# Patient Record
Sex: Female | Born: 1970
Health system: Southern US, Community
[De-identification: ages and names within clinical notes are randomized; demographics above are authoritative.]

## PROBLEM LIST (undated history)

## (undated) DIAGNOSIS — R809 Proteinuria, unspecified: Secondary | ICD-10-CM

## (undated) DIAGNOSIS — I10 Essential (primary) hypertension: Secondary | ICD-10-CM

## (undated) HISTORY — DX: Essential (primary) hypertension: I10

---

## 1999-04-20 ENCOUNTER — Inpatient Hospital Stay (HOSPITAL_COMMUNITY): Admission: AD | Admit: 1999-04-20 | Discharge: 1999-04-20 | Payer: Self-pay | Admitting: Obstetrics

## 1999-08-15 ENCOUNTER — Encounter: Admission: RE | Admit: 1999-08-15 | Discharge: 1999-08-15 | Payer: Self-pay | Admitting: Internal Medicine

## 1999-11-23 ENCOUNTER — Emergency Department (HOSPITAL_COMMUNITY): Admission: EM | Admit: 1999-11-23 | Discharge: 1999-11-23 | Payer: Self-pay | Admitting: Emergency Medicine

## 2000-08-05 ENCOUNTER — Emergency Department (HOSPITAL_COMMUNITY): Admission: EM | Admit: 2000-08-05 | Discharge: 2000-08-05 | Payer: Self-pay | Admitting: Emergency Medicine

## 2000-08-05 ENCOUNTER — Encounter: Payer: Self-pay | Admitting: Emergency Medicine

## 2001-08-30 ENCOUNTER — Emergency Department (HOSPITAL_COMMUNITY): Admission: EM | Admit: 2001-08-30 | Discharge: 2001-08-30 | Payer: Self-pay | Admitting: *Deleted

## 2002-04-28 ENCOUNTER — Emergency Department (HOSPITAL_COMMUNITY): Admission: EM | Admit: 2002-04-28 | Discharge: 2002-04-28 | Payer: Self-pay | Admitting: Emergency Medicine

## 2004-07-08 ENCOUNTER — Ambulatory Visit: Payer: Self-pay | Admitting: Obstetrics and Gynecology

## 2004-07-08 ENCOUNTER — Inpatient Hospital Stay (HOSPITAL_COMMUNITY): Admission: AD | Admit: 2004-07-08 | Discharge: 2004-07-11 | Payer: Self-pay | Admitting: Obstetrics & Gynecology

## 2004-07-08 ENCOUNTER — Encounter: Payer: Self-pay | Admitting: Emergency Medicine

## 2004-07-17 ENCOUNTER — Ambulatory Visit: Payer: Self-pay | Admitting: *Deleted

## 2004-07-24 ENCOUNTER — Ambulatory Visit: Payer: Self-pay | Admitting: Family Medicine

## 2004-07-31 ENCOUNTER — Ambulatory Visit: Payer: Self-pay | Admitting: Obstetrics & Gynecology

## 2004-08-07 ENCOUNTER — Ambulatory Visit: Payer: Self-pay | Admitting: *Deleted

## 2004-08-14 ENCOUNTER — Ambulatory Visit: Payer: Self-pay | Admitting: Obstetrics & Gynecology

## 2004-08-21 ENCOUNTER — Ambulatory Visit: Payer: Self-pay | Admitting: Obstetrics & Gynecology

## 2004-08-21 ENCOUNTER — Ambulatory Visit (HOSPITAL_COMMUNITY): Admission: RE | Admit: 2004-08-21 | Discharge: 2004-08-21 | Payer: Self-pay | Admitting: *Deleted

## 2004-08-28 ENCOUNTER — Ambulatory Visit: Payer: Self-pay | Admitting: *Deleted

## 2004-09-04 ENCOUNTER — Ambulatory Visit: Payer: Self-pay | Admitting: *Deleted

## 2004-09-11 ENCOUNTER — Ambulatory Visit: Payer: Self-pay | Admitting: *Deleted

## 2004-09-11 ENCOUNTER — Ambulatory Visit (HOSPITAL_COMMUNITY): Admission: RE | Admit: 2004-09-11 | Discharge: 2004-09-11 | Payer: Self-pay | Admitting: *Deleted

## 2004-09-16 ENCOUNTER — Ambulatory Visit: Payer: Self-pay | Admitting: Obstetrics and Gynecology

## 2004-09-18 ENCOUNTER — Ambulatory Visit: Payer: Self-pay | Admitting: *Deleted

## 2004-09-23 ENCOUNTER — Ambulatory Visit: Payer: Self-pay | Admitting: Obstetrics and Gynecology

## 2004-09-30 ENCOUNTER — Ambulatory Visit: Payer: Self-pay | Admitting: Obstetrics and Gynecology

## 2004-10-02 ENCOUNTER — Ambulatory Visit: Payer: Self-pay | Admitting: *Deleted

## 2004-10-09 ENCOUNTER — Ambulatory Visit: Payer: Self-pay | Admitting: *Deleted

## 2004-10-14 ENCOUNTER — Ambulatory Visit: Payer: Self-pay | Admitting: Obstetrics & Gynecology

## 2004-10-14 ENCOUNTER — Inpatient Hospital Stay (HOSPITAL_COMMUNITY): Admission: RE | Admit: 2004-10-14 | Discharge: 2004-10-18 | Payer: Self-pay | Admitting: Obstetrics & Gynecology

## 2004-10-21 ENCOUNTER — Inpatient Hospital Stay (HOSPITAL_COMMUNITY): Admission: AD | Admit: 2004-10-21 | Discharge: 2004-10-21 | Payer: Self-pay | Admitting: Obstetrics & Gynecology

## 2004-12-24 ENCOUNTER — Emergency Department (HOSPITAL_COMMUNITY): Admission: EM | Admit: 2004-12-24 | Discharge: 2004-12-25 | Payer: Self-pay | Admitting: Emergency Medicine

## 2006-07-31 ENCOUNTER — Emergency Department (HOSPITAL_COMMUNITY): Admission: EM | Admit: 2006-07-31 | Discharge: 2006-07-31 | Payer: Self-pay | Admitting: Family Medicine

## 2006-09-02 IMAGING — CT CT CHEST W/ CM
1 series · 16 of 31 positions shown, 20 images · IV contrast (omni 300/100)
Comparison: none

CLINICAL DATA: Left breast infection.
 CT CHEST WITH CONTRAST, 12/25/04 AT 2842 HOURS:
TECHNIQUE: 100 cc of Omnipaque 300 Omnipaque 300.

[Series 2: routine chest · axial · 0.73mm/px · z∈[-297,-17]mm · 16 of 62 slices shown, 20 images]
[im 3/62  mediastinal]
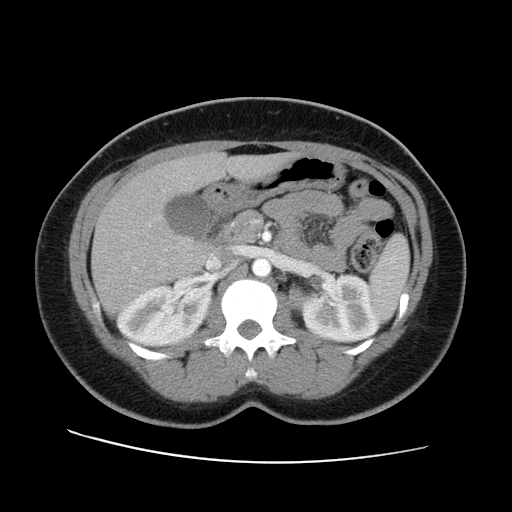
[im 3/62  lung]
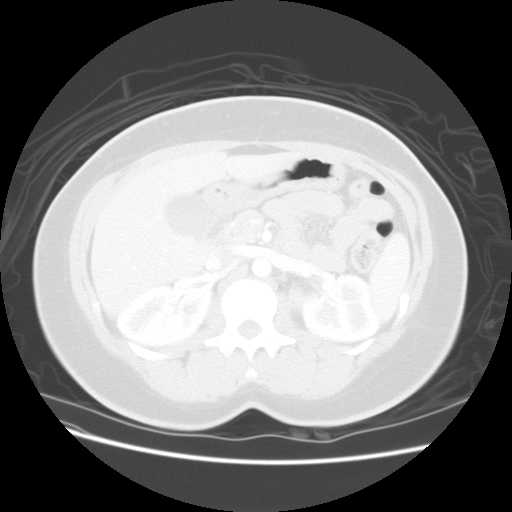
[im 7/62  lung]
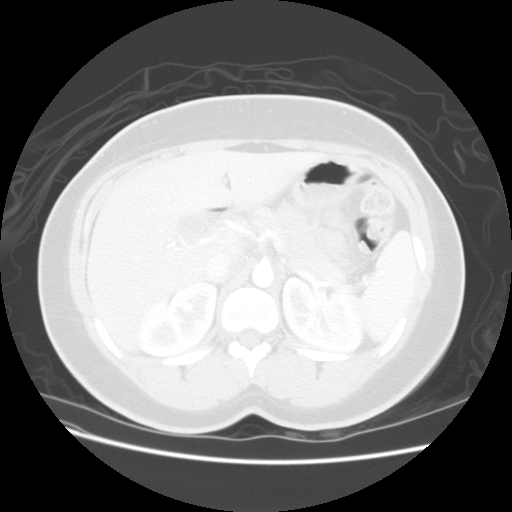
[im 12/62  lung]
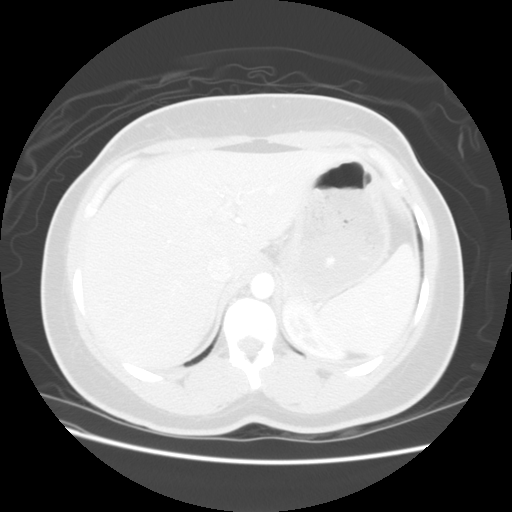
[im 14/62  lung]
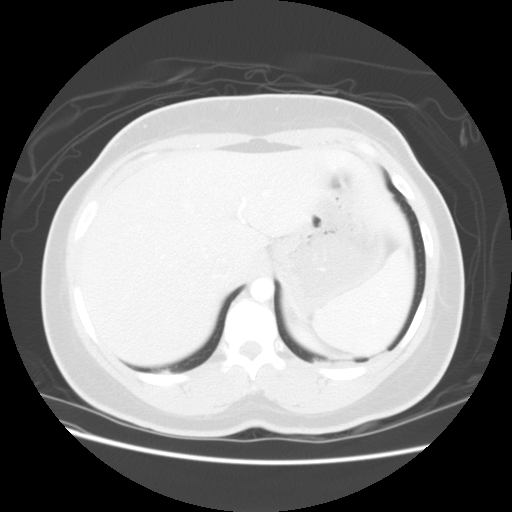
[im 19/62  mediastinal]
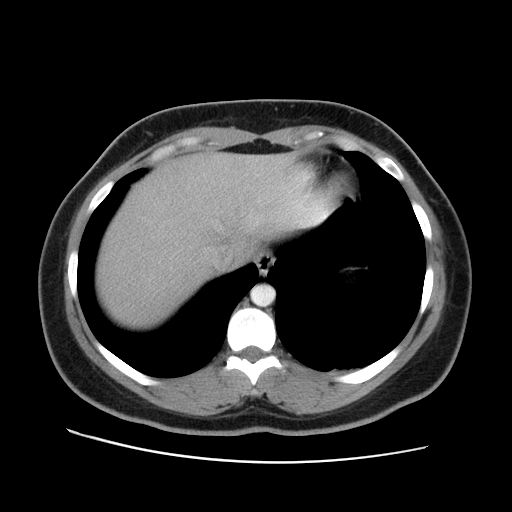
[im 19/62  lung]
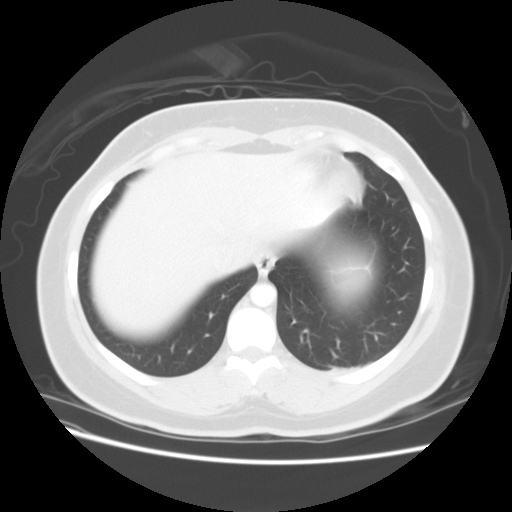
[im 21/62  lung]
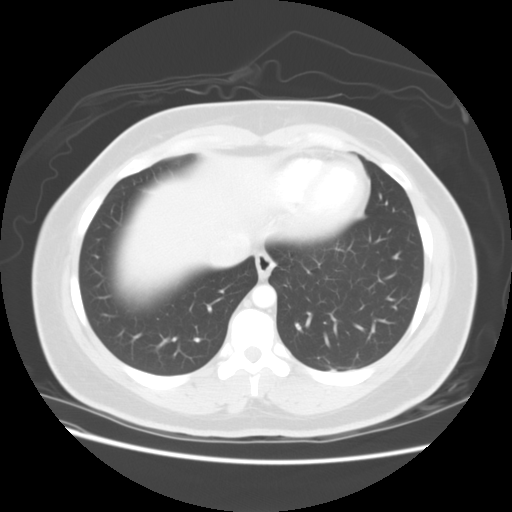
[im 25/62  lung]
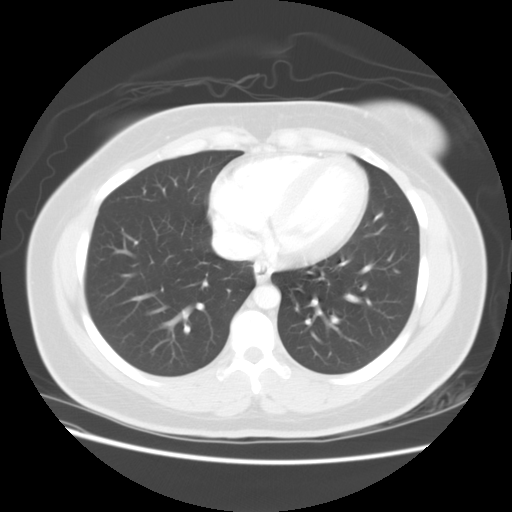
[im 30/62  lung]
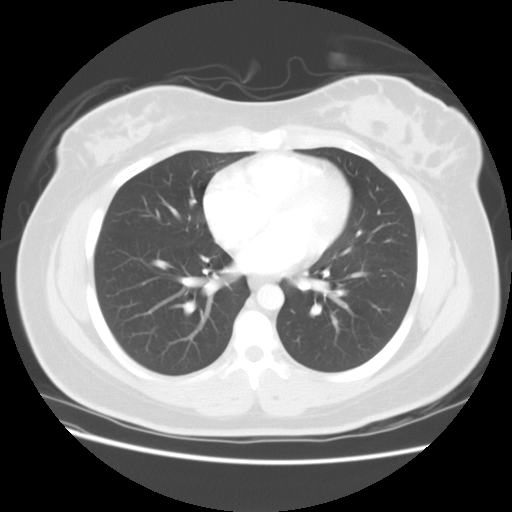
[im 33/62  mediastinal]
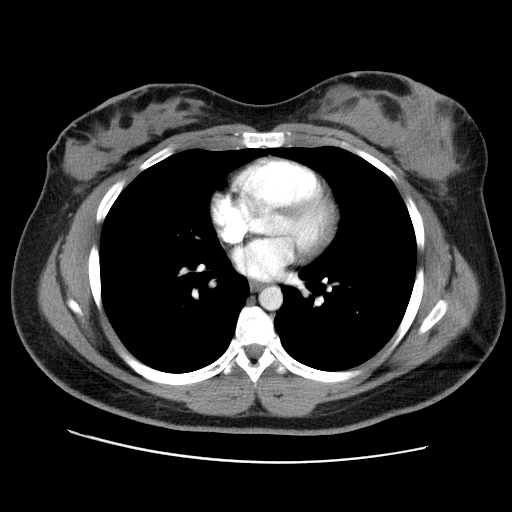
[im 33/62  lung]
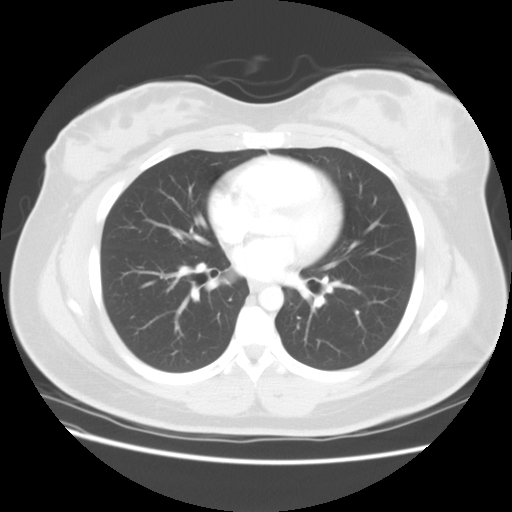
[im 37/62  lung]
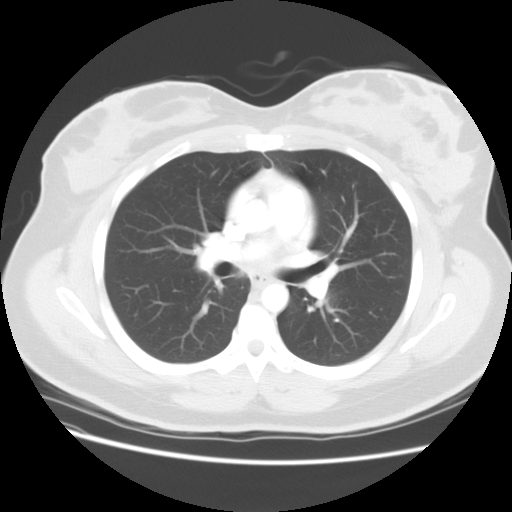
[im 41/62  lung]
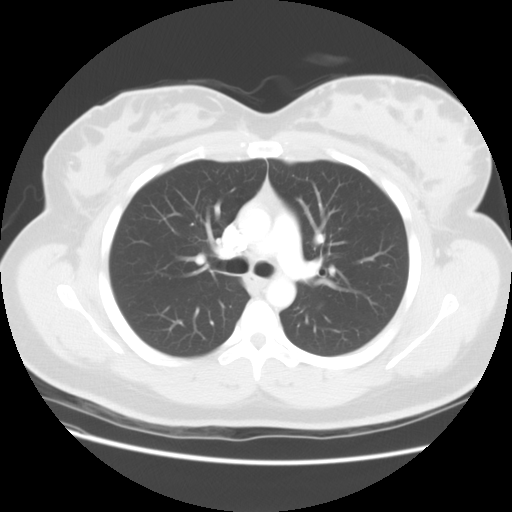
[im 43/62  lung]
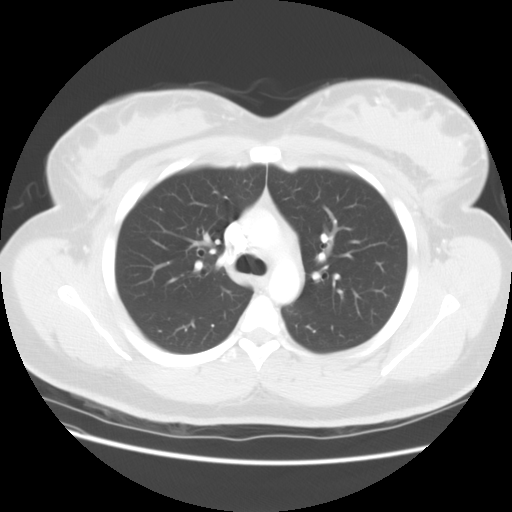
[im 48/62  mediastinal]
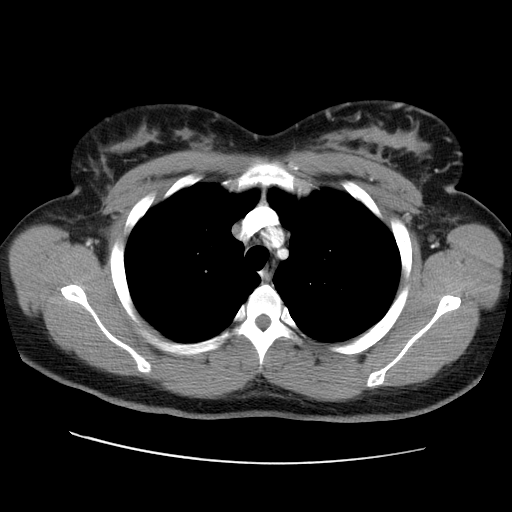
[im 48/62  lung]
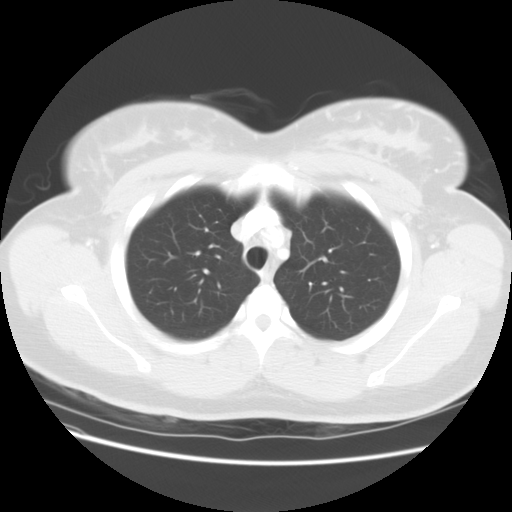
[im 50/62  lung]
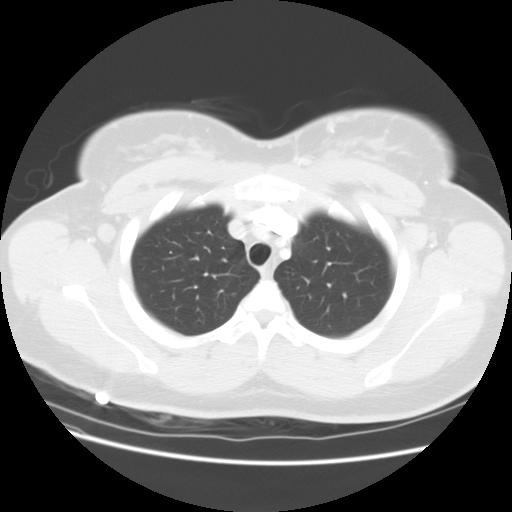
[im 55/62  lung]
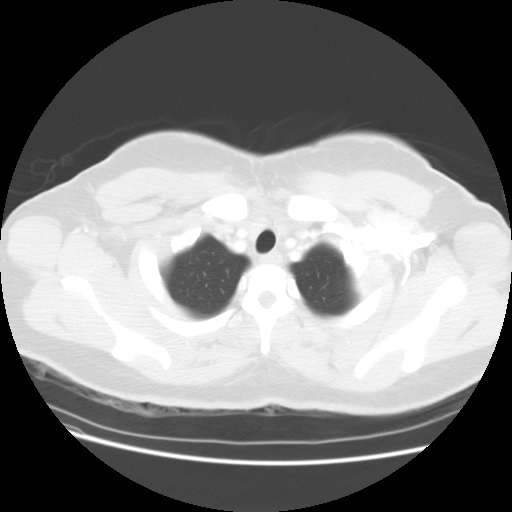
[im 59/62  lung]
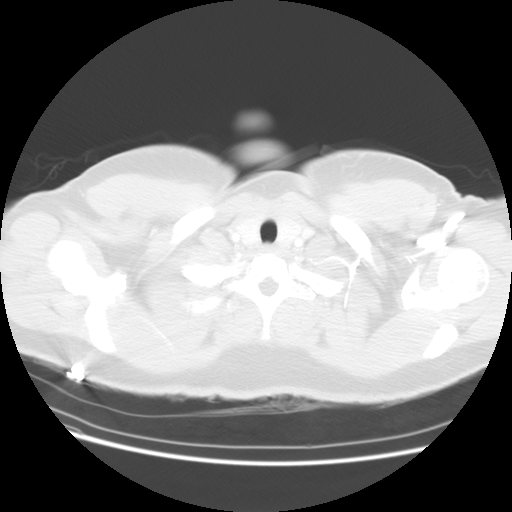

[16 of 31 positions shown; findings below may reference images not displayed]

FINDINGS: Stranding is seen about the left lateral breast tissues.  Asymmetric breast tissue is also present on the left which may simply reflect congestion from an inflammatory process.  Fluid density is noted laterally, but no loculated fluid collection is seen to suggest an abscess.  Adenopathy in the left axilla is noted.  On image #12, there is a 2.8 x 2.0 cm lymph node.  Borderline enlarged right axillary nodes are seen.  
 In the chest, there is no evidence of hilar or mediastinal adenopathy.  Negative pericardial effusion.  No pneumothoraces or effusions are seen.  A 5 mm left upper lobe nodule is seen on image #14.
IMPRESSION: 1.  5 mm left upper lobe nodule.  Get follow-up CT in three to six months.
 2.  Inflammatory process in the lateral left breast.  No definite abscess is present. 
 3.  Abnormal left axillary adenopathy probably reflecting the inflammatory process.

## 2007-01-02 ENCOUNTER — Emergency Department (HOSPITAL_COMMUNITY): Admission: EM | Admit: 2007-01-02 | Discharge: 2007-01-02 | Payer: Self-pay | Admitting: Family Medicine

## 2011-05-15 LAB — POCT URINALYSIS DIP (DEVICE)
Bilirubin Urine: NEGATIVE
Nitrite: NEGATIVE
Urobilinogen, UA: 0.2
pH: 6

## 2012-01-17 ENCOUNTER — Emergency Department (HOSPITAL_COMMUNITY)
Admission: EM | Admit: 2012-01-17 | Discharge: 2012-01-17 | Disposition: A | Discharge status: Home / Self Care | Payer: Self-pay | Attending: Emergency Medicine | Admitting: Emergency Medicine

## 2012-01-17 ENCOUNTER — Encounter (HOSPITAL_COMMUNITY): Payer: Self-pay | Admitting: Nurse Practitioner

## 2012-01-17 ENCOUNTER — Emergency Department (HOSPITAL_COMMUNITY)
Admission: EM | Admit: 2012-01-17 | Discharge: 2012-01-17 | Disposition: A | Discharge status: Other Acute Inpatient Hospital | Payer: Self-pay | Source: Home / Self Care

## 2012-01-17 DIAGNOSIS — K089 Disorder of teeth and supporting structures, unspecified: Secondary | ICD-10-CM | POA: Insufficient documentation

## 2012-01-17 DIAGNOSIS — K0889 Other specified disorders of teeth and supporting structures: Secondary | ICD-10-CM

## 2012-01-17 DIAGNOSIS — Z794 Long term (current) use of insulin: Secondary | ICD-10-CM | POA: Insufficient documentation

## 2012-01-17 DIAGNOSIS — E119 Type 2 diabetes mellitus without complications: Secondary | ICD-10-CM | POA: Insufficient documentation

## 2012-01-17 DIAGNOSIS — F17200 Nicotine dependence, unspecified, uncomplicated: Secondary | ICD-10-CM | POA: Insufficient documentation

## 2012-01-17 MED ORDER — PENICILLIN V POTASSIUM 500 MG PO TABS: 500.0000 mg | ORAL_TABLET | Freq: Three times a day (TID) | ORAL | Status: AC

## 2012-01-17 MED ORDER — NAPROXEN 500 MG PO TABS: 500.0000 mg | ORAL_TABLET | Freq: Two times a day (BID) | ORAL | Status: DC

## 2012-01-17 MED ORDER — HYDROCODONE-ACETAMINOPHEN 5-325 MG PO TABS: ORAL_TABLET | ORAL | Status: AC

## 2012-01-17 MED ORDER — HYDROCODONE-ACETAMINOPHEN 5-325 MG PO TABS
1.0000 | ORAL_TABLET | Freq: Once | ORAL | Status: AC
  Administered 2012-01-17: 1 via ORAL
  Filled 2012-01-17: qty 1

## 2012-01-17 NOTE — ED Notes (Signed)
Pain in L cheek since yesterday, then today with swelling. States "i think its an abcess."

## 2012-01-17 NOTE — ED Provider Notes (Signed)
History     CSN: [id number]  Arrival date & time 01/17/12  1509   First MD Initiated Contact with Patient 01/17/12 1736      Chief Complaint  Patient presents with  . Recurrent Skin Infections    (Consider location/radiation/quality/duration/timing/severity/associated sxs/prior treatment) HPI Comments: Patient presents with left mandibular toothache and jaw swelling starting yesterday. OTC pain medicines prior. No relief. No neck swelling or trouble breathing. No fever. Nothing makes symptoms better or worse. Onset gradual. Course constant.   Patient is a 41 y.o. female presenting with tooth pain. The history is provided by the patient.  Dental PainThe primary symptoms include mouth pain and headaches. Primary symptoms do not include dental injury, oral bleeding, fever, shortness of breath or sore throat. The symptoms began yesterday. The symptoms are unchanged. The symptoms are new. The symptoms occur constantly.  Additional symptoms include: facial swelling. Additional symptoms do not include: trouble swallowing and ear pain.    Past Medical History  Diagnosis Date  . Diabetes mellitus     Past Surgical History  Procedure Date  . Cesarean section     History reviewed. No pertinent family history.  History  Substance Use Topics  . Smoking status: Current Some Day Smoker  . Smokeless tobacco: Not on file  . Alcohol Use: Yes     rare    OB History    Grav Para Term Preterm Abortions TAB SAB Ect Mult Living                  Review of Systems  Constitutional: Negative for fever.  HENT: Positive for facial swelling and dental problem. Negative for ear pain, sore throat, trouble swallowing and neck pain.   Respiratory: Negative for shortness of breath and stridor.   Skin: Negative for color change.  Neurological: Positive for headaches.    Allergies  Review of patient's allergies indicates no known allergies.  Home Medications   Current Outpatient Rx  Name  Route Sig Dispense Refill  . INSULIN ISOPHANE HUMAN 100 UNIT/ML [facility] SUSP Subcutaneous Inject 20-30 Units into the skin 2 (two) times daily. Inject 30 units every morning and inject 20 units every night at bedtime.    . INSULIN REGULAR HUMAN 100 UNIT/ML IJ SOLN Subcutaneous Inject 10-12 Units into the skin 2 (two) times daily before a meal. Inject 12 units every morning and inject 10 units every night at bedtime.      BP 172/89  Pulse 72  Temp 98.6 F (37 C) (Oral)  Resp 16  Ht 5\' 1"  (1.549 m)  Wt 162 lb (73.483 kg)  BMI 30.61 kg/m2  SpO2 98%  Physical Exam  Nursing note and vitals reviewed. Constitutional: She appears well-developed and well-nourished.  HENT:  Head: Normocephalic and atraumatic. No trismus in the jaw.    Right Ear: Tympanic membrane, external ear and ear canal normal.  Left Ear: Tympanic membrane, external ear and ear canal normal.  Nose: Nose normal.  Mouth/Throat: Uvula is midline, oropharynx is clear and moist and mucous membranes are normal. Abnormal dentition. Dental caries present. No dental abscesses or uvula swelling. No tonsillar abscesses.         Patient with L mandibular tooth pain and tenderness to palpation in area of first premolar. No swelling or erythema of gums noted on exam.   Eyes: Conjunctivae are normal.  Neck: Normal range of motion. Neck supple.       No neck swelling or Ludwig's angina  Lymphadenopathy:  She has no cervical adenopathy.  Neurological: She is alert.  Skin: Skin is warm and dry.  Psychiatric: She has a normal mood and affect.    ED Course  Procedures (including critical care time)  Labs Reviewed - No data to display No results found.   1. Pain, dental     6:11 PM Patient seen and examined.   Vital signs reviewed and are as follows: Filed Vitals:   01/17/12 1514  BP: 172/89  Pulse: 72  Temp: 98.6 F (37 C)  Resp: 16   6:11 PM Patient counseled to take prescribed medications as directed, return with  worsening facial or neck swelling, and to follow-up with his dentist as soon as possible.   6:11 PM Patient counseled on use of narcotic pain medications. Counseled not to combine these medications with others containing tylenol. Urged not to drink alcohol, drive, or perform any other activities that requires focus while taking these medications. The patient verbalizes understanding and agrees with the plan.  MDM  Possible dental infection.  No gross abscess.  Exam unconcerning for Ludwig's angina or other deep tissue infection in neck.  Patient does have DM. Will treat with penicillin and pain medicine.  Urged patient to follow-up with dentist.           Emily Crigler, PA 01/18/12 1325

## 2012-01-17 NOTE — Discharge Instructions (Signed)
Please read and follow all provided instructions.  Your diagnoses today include:  1. Pain, dental     Tests performed today include:  Vital signs. See below for your results today.   Medications prescribed:   Vicodin (hydrocodone/acetaminophen) - narcotic pain medication  You have been prescribed narcotic pain medication such as Vicodin, Percocet, or Ultram: DO NOT drive or perform any activities that require you to be awake and alert because this medicine can make you drowsy. BE VERY CAREFUL not to take multiple medicines containing Tylenol (also called acetaminophen). Doing so can lead to an overdose which can damage your liver and cause liver failure and possibly death.    Naproxen - anti-inflammatory pain medication  Do not exceed 500mg  naproxen every 12 hours  You have been prescribed an anti-inflammatory medication or NSAID. Take with food. Take smallest effective dose for the shortest duration needed for your pain. Stop taking if you experience stomach pain or vomiting.    Penicillin - antibiotic for dental infection  You have been prescribed an antibiotic medicine: take the entire course of medicine even if you are feeling better. Stopping early can cause the antibiotic not to work.  Take any prescribed medications only as directed.  Home care instructions:  Follow any educational materials contained in this packet.  Follow-up instructions: Please follow-up with your dentist for further evaluation of your symptoms. If you do not have a dentist or primary care doctor -- see below for referral information.   The exam and treatment you received today has been provided on an emergency basis only. This is not a substitute for complete medical or dental care. If your problem worsens or new symptoms (problems) appear, and you are unable to arrange prompt follow-up care with your dentist, return to this location.  Return instructions:   Please return to the Emergency  Department if you experience worsening symptoms.  Please return if you develop a fever, you develop more swelling in your face or neck, you have trouble breathing or swallowing food.  Please return if you have any other emergent concerns.  Additional Information:  Your vital signs today were: BP 172/89  Pulse 72  Temp 98.6 F (37 C) (Oral)  Resp 16  Ht 5\' 1"  (1.549 m)  Wt 162 lb (73.483 kg)  BMI 30.61 kg/m2  SpO2 98% If your blood pressure (BP) was elevated above 135/85 this visit, please have this repeated by your doctor within one month. -------------- No Primary Care Doctor Call [facility]  [phone] Other agencies that provide inexpensive medical care    [facility]  [phone]    [facility]  [phone]    [facility]  [phone]    [facility]  [phone]    [facility]  [phone]    [facility]  [phone] -------------- RESOURCE GUIDE:  Dental Problems  Patients with Medicaid: [facility] [phone] [address]                                            [phone] [address] Phone:  [phone]  Phone:  [phone]  If unable to pay or uninsured, contact:  [facility] or [facility]. to become qualified for the adult dental clinic.  Chronic Pain Problems Contact [facility]  [phone] Patients need to be referred by their primary care doctor.  Insufficient Money for Medicine Contact [employer]:  call "211" or [facility] [phone].  Psychological Services [facility]  [phone] [facility]  [phone] [facility]   [phone] (emergency services [phone])  Substance Abuse Resources [facility]  [phone] [facility] [phone] [facility] [phone] Floydene Flock [phone] Residential &  Outpatient Substance Abuse Program  [phone]  Abuse/Neglect [facility] Child Abuse Hotline [phone])041-9596 [facility] Child Abuse Hotline 757-751-0830 (After Hours)  Emergency Shelter [facility] [phone]  Maternity Homes [facility] [place] [facility] [phone] [facility] [phone]  [facility] Resources  [facility]     [employer]                          [facility] Dept. [address] [place]                       [address]      [address]  Blondell Reveal Phone:  [phone]                                   Phone:  [phone]                 Phone:  [phone]  [facility] Phone:  [phone]  [facility] Child Abuse Hotline 250-211-3192 [phone] (After Hours)

## 2012-01-18 NOTE — ED Provider Notes (Signed)
Medical screening examination/treatment/procedure(s) were performed by non-physician practitioner and as supervising physician I was immediately available for consultation/collaboration.   Trella Thurmond, MD 01/18/12 1844 

## 2012-04-23 ENCOUNTER — Encounter (HOSPITAL_COMMUNITY): Payer: Self-pay | Admitting: Family Medicine

## 2012-04-23 ENCOUNTER — Emergency Department (HOSPITAL_COMMUNITY): Payer: Self-pay

## 2012-04-23 ENCOUNTER — Emergency Department (HOSPITAL_COMMUNITY)
Admission: EM | Admit: 2012-04-23 | Discharge: 2012-04-23 | Disposition: A | Discharge status: Home / Self Care | Payer: Self-pay | Attending: Emergency Medicine | Admitting: Emergency Medicine

## 2012-04-23 DIAGNOSIS — F17200 Nicotine dependence, unspecified, uncomplicated: Secondary | ICD-10-CM | POA: Insufficient documentation

## 2012-04-23 DIAGNOSIS — Z79899 Other long term (current) drug therapy: Secondary | ICD-10-CM | POA: Insufficient documentation

## 2012-04-23 DIAGNOSIS — E119 Type 2 diabetes mellitus without complications: Secondary | ICD-10-CM | POA: Insufficient documentation

## 2012-04-23 DIAGNOSIS — Z794 Long term (current) use of insulin: Secondary | ICD-10-CM | POA: Insufficient documentation

## 2012-04-23 DIAGNOSIS — R739 Hyperglycemia, unspecified: Secondary | ICD-10-CM

## 2012-04-23 DIAGNOSIS — T148XXA Other injury of unspecified body region, initial encounter: Secondary | ICD-10-CM | POA: Insufficient documentation

## 2012-04-23 DIAGNOSIS — X58XXXA Exposure to other specified factors, initial encounter: Secondary | ICD-10-CM | POA: Insufficient documentation

## 2012-04-23 LAB — CBC WITH DIFFERENTIAL/PLATELET
Basophils Relative: 1 % (ref 0–1)
Eosinophils Relative: 2 % (ref 0–5)
HCT: 36.8 % (ref 36.0–46.0)
MCHC: 35.6 g/dL (ref 30.0–36.0)
MCV: 88.9 fL (ref 78.0–100.0)
Monocytes Absolute: 0.4 10*3/uL (ref 0.1–1.0)
RBC: 4.14 MIL/uL (ref 3.87–5.11)
WBC: 5.8 10*3/uL (ref 4.0–10.5)

## 2012-04-23 LAB — COMPREHENSIVE METABOLIC PANEL
ALT: 17 U/L (ref 0–35)
BUN: 16 mg/dL (ref 6–23)
CO2: 24 mEq/L (ref 19–32)
Chloride: 100 mEq/L (ref 96–112)
Glucose, Bld: 375 mg/dL — ABNORMAL HIGH (ref 70–99)
Potassium: 4.7 mEq/L (ref 3.5–5.1)
Sodium: 134 mEq/L — ABNORMAL LOW (ref 135–145)

## 2012-04-23 LAB — POCT I-STAT TROPONIN I

## 2012-04-23 MED ORDER — CYCLOBENZAPRINE HCL 5 MG PO TABS: 5.0000 mg | ORAL_TABLET | Freq: Once | ORAL | Status: DC

## 2012-04-23 MED ORDER — NAPROXEN 500 MG PO TABS: 500.0000 mg | ORAL_TABLET | Freq: Once | ORAL | Status: DC

## 2012-04-23 MED ORDER — CYCLOBENZAPRINE HCL 10 MG PO TABS
5.0000 mg | ORAL_TABLET | Freq: Once | ORAL | Status: AC
  Administered 2012-04-23: 5 mg via ORAL
  Filled 2012-04-23: qty 1

## 2012-04-23 MED ORDER — NAPROXEN 250 MG PO TABS
500.0000 mg | ORAL_TABLET | Freq: Once | ORAL | Status: AC
  Administered 2012-04-23: 500 mg via ORAL
  Filled 2012-04-23: qty 2

## 2012-04-23 NOTE — ED Notes (Signed)
Pt states that she was sitting then she stood and has a pain across her entire back. Pt states that she was having throbbing along with the pain. Pt ambulatory. Pt able to move all extremities and follow commands.

## 2012-04-23 NOTE — ED Provider Notes (Signed)
History     CSN: [id number]  Arrival date & time 04/23/12  1517   First MD Initiated Contact with Patient 04/23/12 2002      Chief Complaint  Patient presents with  . Back Pain  . Wrist Pain  . Chest Pain    (Consider location/radiation/quality/duration/timing/severity/associated sxs/prior treatment) HPI Comments: Patient states that she is having bilateral side pain and low back pain with certain movements. Also she had several minutes of substernal cheat pain 4 days ago and again today that is not associated with nausea, SOB or diaphoresis. She has been out of her insulin for several months and is borrowing some Lantus from family members but not testing her sugars, has no PCP but recently started back to work and will be able to purchase her own meds. 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 female presenting with back pain, wrist pain, and chest pain. The history is provided by the patient.  Back Pain  This is a recurrent problem. The current episode started more than 1 week ago. The problem has not changed since onset.The pain is associated with no known injury. Associated symptoms include chest pain. Pertinent negatives include no fever, no numbness and no weakness.  Wrist Pain Associated symptoms include chest pain. Pertinent negatives include no chills, coughing, fever, nausea, neck pain, numbness, vomiting or weakness.  Chest Pain Pertinent negatives for primary symptoms include no fever, no shortness of breath, no cough, no wheezing, no palpitations, no nausea, no vomiting and no dizziness.  Pertinent negatives for associated symptoms include no numbness and no weakness.     Past Medical History  Diagnosis Date  . Diabetes mellitus     Past Surgical History  Procedure Date  . Cesarean section     History reviewed. No pertinent family history.  History    Substance Use Topics  . Smoking status: Current Some Day Smoker  . Smokeless tobacco: Not on file  . Alcohol Use: Yes     rare    OB History    Grav Para Term Preterm Abortions TAB SAB Ect Mult Living                  Review of Systems  Constitutional: Negative for fever and chills.  HENT: Negative for neck pain.   Respiratory: Negative for cough, chest tightness, shortness of breath and wheezing.   Cardiovascular: Positive for chest pain. Negative for palpitations and leg swelling.  Gastrointestinal: Negative for nausea and vomiting.  Musculoskeletal: Positive for back pain.  Skin: Negative for wound.  Neurological: Negative for dizziness, weakness and numbness.    Allergies  Review of patient's allergies indicates no known allergies.  Home Medications   Current Outpatient Rx  Name Route Sig Dispense Refill  . INSULIN ISOPHANE HUMAN 100 UNIT/ML [place] SUSP Subcutaneous Inject 20 Units into the skin 2 (two) times daily before a meal.    . INSULIN REGULAR HUMAN 100 UNIT/ML IJ SOLN Subcutaneous Inject into the skin 2 (two) times daily before a meal.    . CYCLOBENZAPRINE HCL 5 MG PO TABS Oral Take 1 tablet (5 mg total) by mouth once. 30 tablet 0  . NAPROXEN 500 MG PO TABS Oral Take 1 tablet (500 mg total) by mouth once. 30 tablet 0    BP 111/68  Pulse 75  Temp 98.7 F (37.1 C) (Oral)  Resp 16  SpO2 99%  LMP 03/25/2012  Physical Exam  Constitutional: She is oriented to person, place, and time. She appears well-developed and well-nourished.  HENT:  Head: Normocephalic.  Eyes: Pupils are equal, round, and reactive to light.  Neck: Normal range of motion.  Cardiovascular: Normal rate.   Pulmonary/Chest: Effort normal. She has no wheezes. She has no rales. She exhibits no tenderness.  Abdominal: Soft. She exhibits no distension.  Musculoskeletal: Normal range of motion.  Neurological: She is alert and oriented to person, place, and time.  Skin: Skin is warm and dry.     ED Course  Procedures (including critical care time)  Labs Reviewed  COMPREHENSIVE METABOLIC PANEL - Abnormal; Notable for the following:    Sodium 134 (*)     Glucose, Bld 375 (*)     Total Bilirubin 0.2 (*)     GFR calc non Af Amer 71 (*)     GFR calc Af Amer 83 (*)     All other components within normal limits  CBC WITH DIFFERENTIAL  POCT I-STAT TROPONIN I   Dg Chest 2 View  04/23/2012  *RADIOLOGY REPORT*  Clinical Data: 2-week history of chest pain.  CHEST - 2 VIEW  Comparison: CT chest 12/25/2004.  Findings: Cardiomediastinal silhouette unremarkable.  Lungs clear. Bronchovascular markings normal.  Pulmonary vascularity normal.  No pleural effusions.  No pneumothorax.  Visualized bony thorax intact.  IMPRESSION: Normal examination.   Original Report Authenticated By: Arnell Sieving, M.D.      1. Hyperglycemia   2. Muscle strain      Date: 04/23/2012  Rate:   Rhythm: normal sinus rhythm with sinus arrythmia  QRS Axis: normal  Intervals: normal  ST/T Wave abnormalities: normal  Conduction Disutrbances:none  Narrative Interpretation:   Old EKG Reviewed: none available    MDM   Reviewed labs, EKG and chest xray --all within normal limits I feel her discomfort is from returning to work which includes lifting, twisting etc.          Arman Filter, NP 04/23/12 2030  Arman Filter, NP 04/23/12 2030

## 2012-04-23 NOTE — ED Notes (Signed)
Per pt sts lower back pain, wrist pain when she is moving it and chest tightness associated with her eye twitching. Denies N,V,D. Denies SOB, cough, fevers.

## 2012-04-24 NOTE — ED Provider Notes (Signed)
Medical screening examination/treatment/procedure(s) were performed by non-physician practitioner and as supervising physician I was immediately available for consultation/collaboration.  Marlei Glomski R. Georgeann Brinkman, MD 04/24/12 2254 

## 2012-05-20 ENCOUNTER — Telehealth: Payer: Self-pay

## 2012-05-20 NOTE — Telephone Encounter (Signed)
Okay, put this in [place], so I can document in chart not in epic, thanks Amy

## 2012-05-20 NOTE — Telephone Encounter (Signed)
Pt was seen in our office on 05/13/12 for workers comp and has misplaced the mobic rx and would like it called into the [facility] on [address]

## 2013-03-02 ENCOUNTER — Encounter (HOSPITAL_COMMUNITY): Payer: Self-pay | Admitting: Emergency Medicine

## 2013-03-02 ENCOUNTER — Emergency Department (HOSPITAL_COMMUNITY)
Admission: EM | Admit: 2013-03-02 | Discharge: 2013-03-02 | Disposition: A | Discharge status: Home / Self Care | Payer: Self-pay | Attending: Emergency Medicine | Admitting: Emergency Medicine

## 2013-03-02 DIAGNOSIS — F17200 Nicotine dependence, unspecified, uncomplicated: Secondary | ICD-10-CM | POA: Insufficient documentation

## 2013-03-02 DIAGNOSIS — K068 Other specified disorders of gingiva and edentulous alveolar ridge: Secondary | ICD-10-CM

## 2013-03-02 DIAGNOSIS — Z794 Long term (current) use of insulin: Secondary | ICD-10-CM | POA: Insufficient documentation

## 2013-03-02 DIAGNOSIS — E119 Type 2 diabetes mellitus without complications: Secondary | ICD-10-CM | POA: Insufficient documentation

## 2013-03-02 DIAGNOSIS — Z79899 Other long term (current) drug therapy: Secondary | ICD-10-CM | POA: Insufficient documentation

## 2013-03-02 DIAGNOSIS — K055 Other periodontal diseases: Secondary | ICD-10-CM | POA: Insufficient documentation

## 2013-03-02 MED ORDER — IBUPROFEN 800 MG PO TABS: 800.0000 mg | ORAL_TABLET | Freq: Three times a day (TID) | ORAL | Status: DC | PRN

## 2013-03-02 MED ORDER — IBUPROFEN 400 MG PO TABS
800.0000 mg | ORAL_TABLET | Freq: Once | ORAL | Status: AC
  Administered 2013-03-02: 800 mg via ORAL

## 2013-03-02 NOTE — ED Notes (Signed)
Pt c/o left lower dental pain.  

## 2013-03-02 NOTE — ED Notes (Signed)
PT ambulated with baseline gait; VSS; A&Ox3; no signs of distress; respirations even and unlabored; skin warm and dry; no questions upon discharge.  

## 2013-03-02 NOTE — Discharge Instructions (Signed)
Read the information below.  Use the prescribed medication as directed.  Please discuss all new medications with your pharmacist.  You may return to the Emergency Department at any time for worsening condition or any new symptoms that concern you.  If you develop redness, swelling, pus draining from the wound, difficulty swallowing or breathing, sore throat, or fevers greater than 100.4, return to the ER immediately for a recheck.    Gum Disease Gum disease is an infection of the tissues that surround and support the teeth (periodontium). This includes the gums, connective tissue fibers (ligaments), and the thickened ridges of the tooth bone (sockets). The disease is caused by germs (bacteria) that grow in soft deposits (plaque) on the teeth. This results in redness, soreness, and swelling (inflammation). This inflammation causes the gums to bleed. If left untreated, it can lead to damage of the tissues and supportive bone. Although bacteria are known as the major cause of gum disease, other risk factors include tobacco use, diabetes, certain medications, hormones, pregnancy, and genetic factors. SYMPTOMS   Gums that bleed easily.  Red or swollen gums.  Bad breath that does not go away.  Gums that have pulled away from the teeth.  Loose or separating permanent teeth.  Painful chewing.  Changes in the way your teeth fit together. DIAGNOSIS  A thorough exam will be performed by a dentist to determine the presence and stage of gum disease. The stage is how far the gum disease has developed. TREATMENT  Treatment is based on the stages of gum disease. The stages include:  Mild. If it is caught early, conditions can improve by brushing and flossing properly.  Moderate. You may need special cleaning (scaling and root planing). This method removes plaque and hardened plaque (tartar) above and below the gum line. Medication may also be used to treat moderate gum disease.  Severe. This stage  requires surgery of the gums and supporting bone. PREVENTION  You can prevent gum disease by:  Practicing good oral hygiene, including brushing and flossing properly.  Avoiding use of tobacco products.  Scheduling regular dental check-ups and cleanings.  Eating a well-balanced diet. SEEK IMMEDIATE DENTAL OR MEDICAL CARE IF:  You have fever over 102 F (38.9 C).  You have swelling of your face, neck, or jaw.  You are unable to open your mouth.  You have severe pain not controlled by pain medicine. Document Released: 01/01/2010 Document Revised: 04/07/2012 Document Reviewed: 01/01/2010 [facility] Patient Information 2014 [place], [place].   RESOURCE GUIDE  Chronic Pain Problems: Contact [facility]  [phone] Patients need to be referred by their primary care doctor.  Insufficient Money for Medicine: Contact United Way:  call [phone]  No Primary Care Doctor: - Call Health Connect  [phone] - can help you locate a primary care doctor that  accepts your insurance, provides certain services, etc. - Physician Referral [phone]  Agencies that provide inexpensive medical care: - [facility]  [phone] - [facility]  [phone] - [facility]  [phone] - Women's Clinic  [phone] - Planned Parenthood  [phone] [facility]  [phone]  Medicaid-accepting [facility] Providers: - [facility]- [address]  [phone], Mon-Fri 9am-7pm, Sat 9am-1pm - [facility]- [address] [place], Suite [place]  [phone] - [facility]- [address] [place]  [phone] - [facility]- [address]  [phone] Renaye Rakers- [address], [address], [phone]  Only accepts [place] patients after they have their name  applied to their card  Self Pay (no insurance) in [facility]: - Sickle Cell Patients - [facility]  [address] [place], [phone] - [facility]- [address] [place]  [phone]       [facility] [place]- [address]       -     [facility]- see information above (Speak to [employer] if you do not have insurance)       -  [facility]- [address],  [phone]       -  [facility]- [address] [phone]       -  Dr Julio Sicks-  [address], [place], [phone]       -  [facility] - [address] [phone]       -  [facility]- [address] [phone], also [address], [phone]       -     [facility]- [address] [place], [phone], 1st & 3rd Saturday         every month, 10am-1pm  -     [facility]   [address] [place], [place].   Phone:  [phone], Fax:  [phone]. Hours of Operation:  9 am - 6 pm, M-F.  -     [facility]   [address], [place]   Phone: [phone], Fax: [phone]. Hours of Operation:  8:30 am - 5:30 pm, M-F.  [facility] [address] [place], [place] [address] [phone]  [facility] [address] [place], [place] [address] [phone]  1) Find a Doctor and Pay Out of Pocket Although you won't have to find out who is covered by your insurance plan, it is a good idea to ask around and get recommendations. You will then need to call the office and see if the doctor you have chosen will accept you as a new patient and what types of options they offer for patients who are self-pay. Some doctors offer discounts or will set up payment plans for their patients who do not have insurance, but you will need to ask so you aren't surprised when you get to your appointment.  2) Contact Your Local Health Department Not all health departments  have doctors that can see patients for sick visits, but many do, so it is worth a call to see if yours does. If you don't know where your local health department is, you can check in your phone book. The CDC also has a tool to help you locate your state's health department, and many state websites also have listings of all of their local health departments.  3) Find a Walk-in Clinic If your illness is not likely to be very severe or complicated, you may want to try a walk in clinic. These are popping up all over the country in pharmacies, drugstores, and shopping centers. They're usually staffed by nurse practitioners or physician assistants that have been trained to treat common illnesses and complaints. They're usually fairly quick and inexpensive. However, if you have serious medical issues or chronic medical  problems, these are probably not your best option  STD Testing - [facility] Department of [facility] [place], STD Clinic, [address] [place], phone [phone] or [phone].  Monday - Friday, call for an appointment. [facility] Department of [employer], STD Clinic, [address], [place], phone [phone] or [phone].  Monday - Friday, call for an appointment.  Abuse/Neglect: [facility] Child Abuse Hotline 516-224-1340 [facility] Child Abuse Hotline (435)572-8458 (After Hours)  Emergency Shelter:  [facility] 908-070-6392  Maternity Homes: - Room at the [place] [facility] 8197782122 - [facility] [phone]  MRSA Hotline #:   506 700 1776  Dental Assistance If unable to pay or uninsured, contact:  [facility]. to become qualified for the adult dental clinic.  Patients with Medicaid: [facility] [phone] [address], [phone] [address], [phone]  If unable to pay, or uninsured, contact [facility]  [phone] in [place], [phone] in [facility]) to become qualified for the adult dental clinic  [facility] [address] [place], [place] [address] [phone] [email or web address]  Other Proofreader Services: - [facility]- [address] [place], [place], [place], [address], [phone], Ext. [phone], 2nd and 4th Thursday of the month at 6:30am.  10 clients each day by appointment, can sometimes see walk-in patients if someone does not show for an appointment. [facility]- [address] [place] [employer], [place], [address], [phone] - [facility] [address] [place], [place], [address], [phone] - [facility]- [phone] [facility] Health Department- [phone] [phone]       Behavioral Health Resources in the [facility]  Intensive Outpatient Programs: [facility]      [address] [place], [place] [phone] Both a day and evening program       [facility] Outpatient     [address]        [place], [place] [address] [phone]         ADS: [facility] [address] [place] [phone]  [facility] Mental Health ACCESS LINE: 305-077-4041 or [phone] [address] [place], [place] [address] [email or web address]   Substance Abuse Resources: - [facility] Drug Services  ([phone] - [facility] [phone] - [facility] [place] [phone] Floydene Flock [phone] - Residential & Outpatient Substance Abuse Program  [phone]  Psychological Services: [facility]  [phone] [facility] Services  [phone] - [facility], [phone] [place]. [address], [place], ACCESS LINE: 979-228-4287 or [phone], [email or web address]  Mobile Crisis Teams:                                         Therapeutic Alternatives         Mobile Crisis Care Unit [phone]             [facility] [address] [place]inette Otto [phone]                                         Interventionist [address] [place] [place] [phone]  Self-Help/Support  Groups: Mental Health Assoc. of [employer] of support groups ([phone] [phone] (call for more info)  Narcotics Anonymous (NA) Caring Services [address] [place] - 2 meetings at this location  Residential Treatment Programs:  [facility]      [address]        [place]       [phone]         [facility] [address], [place] [address] [place], [place]  [address] [phone]  [facility]  [address] [place], [place] [address] [phone] Admissions: 8am-3pm M-F  [facility]     [address]        [place], [place] [address]       [phone]         [facility] [address] [place], [place] [phone]  [facility] [address] [place], [place] [phone]  [facility] - Intensive Outpatient      [address]     [place], [place]       [phone]         [facility] (Addiction [facility]     [address] [place], [place] [phone] or [phone]  Residential Treatment Services (RTS), Medicaid [address] [place], [place] [phone]  [facility] [address]                                               [address] [place] [phone]  [facility] Resources: [phone]               General Therapy                                                Angie Fava, PhD        [address] [place], [place] [address]         [phone]   Insurance  [facility]   [address] [place], [place] [address] [phone]  [facility] [address] [place], [place] [address] [phone] Insurance/Medicaid/sponsorship through [facility]                                              [address]                                        [place], [place] [address]    Therapy/tele-psych/case         [phone]          [facility] [address], [place]  [address]  Adolescent/group home/case management [phone]  Creola Corn PhD       General therapy       Insurance   [phone]         Dr. Lolly Mustache, Insurance, M-F [phone]  [facility]  United Way [facility] Dept. [address]                 [address]         [address] [place] [address]  [place]ondell Reveal Phone:  [phone]                                  Phone:  [phone]                   Phone:  [phone]  [facility] Mental Health, [phone] - [facility] - [facility]- [phone]       -     [facility] in [place], [address],             [phone], Insurance  [place] Child Abuse Hotline 207-799-4326 or [phone] (After Hours)

## 2013-03-02 NOTE — ED Provider Notes (Signed)
  Medical screening examination/treatment/procedure(s) were performed by non-physician practitioner and as supervising physician I was immediately available for consultation/collaboration.    Gerhard Munch, MD 03/02/13 1550

## 2013-03-02 NOTE — ED Provider Notes (Signed)
CSN: [id number]     Arrival date & time 03/02/13  1224 History     First MD Initiated Contact with Patient 03/02/13 1312     Chief Complaint  Patient presents with  . Dental Pain   (Consider location/radiation/quality/duration/timing/severity/associated sxs/prior Treatment) HPI Comments: Patient reports she has noticed an area of swelling "that I can lift up" in the left lower gingival region 2-3 weeks ago.  NOticed it became painful 2 days ago.  Denies fevers, chills, body aches, sore throat, difficulty swallowing or breathing, or dental pain.   Patient is a 42 y.o. female presenting with tooth pain. The history is provided by the patient.  Dental Pain Associated symptoms: no fever     Past Medical History  Diagnosis Date  . Diabetes mellitus    Past Surgical History  Procedure Laterality Date  . Cesarean section     History reviewed. No pertinent family history. History  Substance Use Topics  . Smoking status: Current Some Day Smoker  . Smokeless tobacco: Not on file  . Alcohol Use: Yes     Comment: rare   OB History   Grav Para Term Preterm Abortions TAB SAB Ect Mult Living                 Review of Systems  Constitutional: Negative for fever and chills.  HENT: Negative for sore throat, trouble swallowing, dental problem and voice change.     Allergies  Review of patient's allergies indicates no known allergies.  Home Medications   Current Outpatient Rx  Name  Route  Sig  Dispense  Refill  . insulin NPH (HUMULIN N,NOVOLIN N) 100 UNIT/ML injection   Subcutaneous   Inject 20 Units into the skin 2 (two) times daily.          . insulin regular (NOVOLIN R,HUMULIN R) 100 units/mL injection   Subcutaneous   Inject 10 Units into the skin 2 (two) times daily.          . metroNIDAZOLE (FLAGYL) 500 MG tablet   Oral   Take 500 mg by mouth 2 (two) times daily.          BP 120/55  Pulse 75  Temp(Src) 99.1 F (37.3 C) (Oral)  Resp 20  SpO2 98% Physical  Exam  Nursing note and vitals reviewed. Constitutional: She appears well-developed and well-nourished. No distress.  HENT:  Head: Normocephalic and atraumatic.  Apparent polyp left gingiva.  Tender.   Growth over left lower gingiva adjacent to 1st premolar. Tender.   Teeth nontender to percussion  Neck: Neck supple.  Pulmonary/Chest: Effort normal.  Neurological: She is alert.  Skin: She is not diaphoretic.    ED Course   Procedures (including critical care time)  Labs Reviewed - No data to display No results found. 1. Gingival polyp   2. Gum lesion     MDM  Pt with left lower gingival polyp and lesion over gingiva.  Does not appear to be infected.  No dental pain or tenderness suggestive of a dental abscess.  Polyp does appear to be irritated and is tender.  D/C home with ibuprofen, dental follow up.  Discussed  findings, treatment, follow up  with patient.  Pt given return precautions.  Pt verbalizes understanding and agrees with plan.     I doubt any other EMC precluding discharge at this time including, but not necessarily limited to the following: dental abscess, mouth infection, deep space head or neck infection.   Trixie Dredge,  PA-C 03/02/13 1527

## 2013-05-15 ENCOUNTER — Encounter (HOSPITAL_COMMUNITY): Payer: Self-pay | Admitting: Emergency Medicine

## 2013-05-15 ENCOUNTER — Emergency Department (HOSPITAL_COMMUNITY)
Admission: EM | Admit: 2013-05-15 | Discharge: 2013-05-15 | Disposition: A | Discharge status: Home / Self Care | Payer: Self-pay | Attending: Emergency Medicine | Admitting: Emergency Medicine

## 2013-05-15 DIAGNOSIS — X500XXA Overexertion from strenuous movement or load, initial encounter: Secondary | ICD-10-CM | POA: Insufficient documentation

## 2013-05-15 DIAGNOSIS — Y990 Civilian activity done for income or pay: Secondary | ICD-10-CM | POA: Insufficient documentation

## 2013-05-15 DIAGNOSIS — T148XXA Other injury of unspecified body region, initial encounter: Secondary | ICD-10-CM

## 2013-05-15 DIAGNOSIS — F17200 Nicotine dependence, unspecified, uncomplicated: Secondary | ICD-10-CM | POA: Insufficient documentation

## 2013-05-15 DIAGNOSIS — Y9289 Other specified places as the place of occurrence of the external cause: Secondary | ICD-10-CM | POA: Insufficient documentation

## 2013-05-15 DIAGNOSIS — Y9389 Activity, other specified: Secondary | ICD-10-CM | POA: Insufficient documentation

## 2013-05-15 DIAGNOSIS — IMO0002 Reserved for concepts with insufficient information to code with codable children: Secondary | ICD-10-CM | POA: Insufficient documentation

## 2013-05-15 DIAGNOSIS — Z794 Long term (current) use of insulin: Secondary | ICD-10-CM | POA: Insufficient documentation

## 2013-05-15 DIAGNOSIS — E119 Type 2 diabetes mellitus without complications: Secondary | ICD-10-CM | POA: Insufficient documentation

## 2013-05-15 MED ORDER — METHOCARBAMOL 500 MG PO TABS: 500.0000 mg | ORAL_TABLET | Freq: Two times a day (BID) | ORAL | Status: DC

## 2013-05-15 MED ORDER — NAPROXEN 500 MG PO TABS: 500.0000 mg | ORAL_TABLET | Freq: Two times a day (BID) | ORAL | Status: DC

## 2013-05-15 NOTE — ED Notes (Signed)
Pt reports bilateral shoulder pain, more severe to right shoulder. Does heavy lifting at work but denies any injury. Ambulatory, no acute distress noted.

## 2013-05-15 NOTE — ED Provider Notes (Signed)
CSN: [id number]     Arrival date & time 05/15/13  1508 History  This chart was scribed for non-physician practitioner, Magnus Sinning, PA-C working with Rolan Bucco, MD by Dorothey Baseman, ED scribe. This patient was seen in room TR11C/TR11C and the patient's care was started at 4:10 PM.   Chief Complaint  Patient presents with  . Shoulder Pain   The history is provided by the patient. No language interpreter was used.   HPI Comments: Emily Clay is a 42 y.o. female who presents to the Emergency Department complaining of gradual onset, constant, burning bilateral shoulder pain and tightness, right worst than left, onset 2 weeks ago that she states is new for her. She denies any potential injury to the area. She states that she has a two year old grandchild that she picks up a lot and lifts boxes frequently at work. She reports taking Tylenol at home with mild, temporary relief. She denies any paresthesias or weakness. Denies fever or chills.  Patient reports a history of carpal tunnel with associated numbness, but denies any recent changes.   Past Medical History  Diagnosis Date  . Diabetes mellitus    Past Surgical History  Procedure Laterality Date  . Cesarean section     History reviewed. No pertinent family history. History  Substance Use Topics  . Smoking status: Current Some Day Smoker  . Smokeless tobacco: Not on file  . Alcohol Use: Yes     Comment: rare   OB History   Grav Para Term Preterm Abortions TAB SAB Ect Mult Living                 Review of Systems   A complete 10 system review of systems was obtained and all systems are negative except as noted in the HPI and PMH.   Allergies  Review of patient's allergies indicates no known allergies.  Home Medications   Current Outpatient Rx  Name  Route  Sig  Dispense  Refill  . insulin NPH (HUMULIN N,NOVOLIN N) 100 UNIT/ML injection   Subcutaneous   Inject 20 Units into the skin 2 (two) times daily.           . insulin regular (NOVOLIN R,HUMULIN R) 100 units/mL injection   Subcutaneous   Inject 10 Units into the skin 2 (two) times daily.           BP 143/73  Pulse 62  Resp 18  SpO2 100%  LMP 05/04/2013  Physical Exam  Nursing note and vitals reviewed. Constitutional: She is oriented to person, place, and time. She appears well-developed and well-nourished.  HENT:  Head: Normocephalic and atraumatic.  Eyes: Conjunctivae and EOM are normal.  Neck: Normal range of motion. Neck supple.  Cardiovascular: Normal rate, regular rhythm and normal heart sounds.   Pulmonary/Chest: Effort normal and breath sounds normal. No respiratory distress. She has no wheezes.  Musculoskeletal: Normal range of motion.       Cervical back: She exhibits normal range of motion, no tenderness, no bony tenderness, no swelling and no edema.       Thoracic back: She exhibits normal range of motion, no tenderness, no bony tenderness, no swelling, no edema and no deformity.  Tenderness to palpation over bilateral trapezius muscles. Tightness of the trapezius muscle palpated.  No bony tenderness appreciated. 2+ radial pulses bilaterally.   Neurological: She is alert and oriented to person, place, and time.  Distal sensation of both hands is intact.  Skin: Skin is warm and dry.  Psychiatric: She has a normal mood and affect. Her behavior is normal.    ED Course  Procedures (including critical care time)   COORDINATION OF CARE: 4:14 PM- Discussed that symptoms are likely muscular in nature. Advised patient to take anti-inflammatory medications, such as Naproxen and ibuprofen, and to apply warm compresses. Will discharge patient with muscle relaxants to manage symptoms. Discussed treatment plan with patient at bedside and patient verbalized agreement.   Labs Review Labs Reviewed - No data to display Imaging Review No results found.  EKG Interpretation   None       MDM  No diagnosis found. Patient  presenting with pain over the trapezius.  She does a lot of lifting of boxes at work.  Suspect that the pain is muscular.  No spinal tenderness to palpation.  Patient instructed to take NSAIDs and muscle relaxer.    I personally performed the services described in this documentation, which was scribed in my presence. The recorded information has been reviewed and is accurate.   Pascal Lux [place], PA-C 05/16/13 0005

## 2013-05-16 NOTE — ED Provider Notes (Signed)
Medical screening examination/treatment/procedure(s) were performed by non-physician practitioner and as supervising physician I was immediately available for consultation/collaboration.   Rolan Bucco, MD 05/16/13 [phone]

## 2013-06-27 ENCOUNTER — Inpatient Hospital Stay: Payer: Self-pay | Admitting: Internal Medicine

## 2013-07-25 ENCOUNTER — Ambulatory Visit: Payer: Self-pay | Attending: Internal Medicine | Admitting: Internal Medicine

## 2013-07-25 ENCOUNTER — Encounter: Payer: Self-pay | Admitting: Internal Medicine

## 2013-07-25 VITALS — BP 133/82 | HR 59 | Temp 99.3°F | Resp 16 | Ht 61.0 in | Wt 183.0 lb

## 2013-07-25 DIAGNOSIS — T148XXA Other injury of unspecified body region, initial encounter: Secondary | ICD-10-CM

## 2013-07-25 DIAGNOSIS — Z139 Encounter for screening, unspecified: Secondary | ICD-10-CM

## 2013-07-25 DIAGNOSIS — E119 Type 2 diabetes mellitus without complications: Secondary | ICD-10-CM

## 2013-07-25 LAB — CBC WITH DIFFERENTIAL/PLATELET
Eosinophils Absolute: 0.3 10*3/uL (ref 0.0–0.7)
Eosinophils Relative: 7 % — ABNORMAL HIGH (ref 0–5)
HCT: 39.2 % (ref 36.0–46.0)
Hemoglobin: 13.2 g/dL (ref 12.0–15.0)
Lymphs Abs: 1.5 10*3/uL (ref 0.7–4.0)
MCH: 30.8 pg (ref 26.0–34.0)
MCV: 91.6 fL (ref 78.0–100.0)
Monocytes Absolute: 0.3 10*3/uL (ref 0.1–1.0)
Monocytes Relative: 7 % (ref 3–12)
RBC: 4.28 MIL/uL (ref 3.87–5.11)

## 2013-07-25 LAB — LIPID PANEL
HDL: 54 mg/dL (ref >39)
LDL Cholesterol: 101 mg/dL — ABNORMAL HIGH (ref 0–99)
Total CHOL/HDL Ratio: 3 Ratio
VLDL: 9 mg/dL (ref 0–40)

## 2013-07-25 LAB — GLUCOSE, POCT (MANUAL RESULT ENTRY): POC Glucose: 131 mg/dl — AB (ref 70–99)

## 2013-07-25 LAB — COMPLETE METABOLIC PANEL WITH GFR
ALT: 18 U/L (ref 0–35)
AST: 18 U/L (ref 0–37)
Alkaline Phosphatase: 50 U/L (ref 39–117)
Calcium: 8.8 mg/dL (ref 8.4–10.5)
Chloride: 104 mEq/L (ref 96–112)
Creat: 0.7 mg/dL (ref 0.50–1.10)
Sodium: 136 mEq/L (ref 135–145)
Total Bilirubin: 0.4 mg/dL (ref 0.3–1.2)
Total Protein: 7 g/dL (ref 6.0–8.3)

## 2013-07-25 MED ORDER — INSULIN REGULAR HUMAN 100 UNIT/ML IJ SOLN: 10.0000 [IU] | Freq: Two times a day (BID) | INTRAMUSCULAR | Status: DC

## 2013-07-25 MED ORDER — CYCLOBENZAPRINE HCL 10 MG PO TABS: 10.0000 mg | ORAL_TABLET | Freq: Every day | ORAL | Status: DC

## 2013-07-25 MED ORDER — CYCLOBENZAPRINE HCL 10 MG PO TABS: 10.0000 mg | ORAL_TABLET | Freq: Three times a day (TID) | ORAL | Status: DC | PRN

## 2013-07-25 MED ORDER — INSULIN NPH (HUMAN) (ISOPHANE) 100 UNIT/ML ~~LOC~~ SUSP: 20.0000 [IU] | Freq: Two times a day (BID) | SUBCUTANEOUS | Status: DC

## 2013-07-25 NOTE — Progress Notes (Signed)
HFU Here today to help manage her shoulder pain.

## 2013-07-25 NOTE — Patient Instructions (Signed)

## 2013-07-25 NOTE — Progress Notes (Signed)
Patient Demographics  Emily Clay, is a 41 y.o. female  ZOX:[id number]  WJX:[id number]  DOB - February 03, 1971  CC:  Chief Complaint  Patient presents with  . Hospitalization Follow-up       HPI: Emily Clay is a 42 y.o. female here today to establish medical care. Patient went to the emergency room 2 months ago had symptoms of muscle strain, she tried Robaxin still has symptoms denies any fall or trauma denies any numbness weakness, patient also has history of diabetes type 1 and is on insulin NPH 20 units twice a day and regular insulin 10 units twice a day, she denies any hypoglycemic symptoms today her hemoglobin A1c is 8%, patient used to see ophthalmologist has lost follow. Patient has No headache, No chest pain, No abdominal pain - No Nausea, No new weakness tingling or numbness, No Cough - SOB.  No Known Allergies Past Medical History  Diagnosis Date  . Diabetes mellitus    Current Outpatient Prescriptions on File Prior to Visit  Medication Sig Dispense Refill  . Menthol-Methyl Salicylate (MUSCLE RUB) 10-15 % CREA Apply 1 application topically as needed (for muscle pain).      . methocarbamol (ROBAXIN) 500 MG tablet Take 1 tablet (500 mg total) by mouth 2 (two) times daily.  20 tablet  0  . naproxen (NAPROSYN) 500 MG tablet Take 1 tablet (500 mg total) by mouth 2 (two) times daily.  30 tablet  0  . acetaminophen (TYLENOL) 500 MG tablet Take 1,000 mg by mouth daily as needed for pain.       No current facility-administered medications on file prior to visit.   Family History  Problem Relation Age of Onset  . Diabetes Mother   . Hypertension Mother   . Cancer Mother   . Diabetes Sister   . Hypertension Sister   . Hypertension Brother   . Diabetes Maternal Aunt    History   Social History  . Marital Status: Single    Spouse Name: N/A    Number of Children: N/A  . Years of Education: N/A   Occupational History  . Not on file.   Social History Main Topics  .  Smoking status: Current Some Day Smoker  . Smokeless tobacco: Not on file  . Alcohol Use: No     Comment: rare  . Drug Use: No  . Sexual Activity: Yes   Other Topics Concern  . Not on file   Social History Narrative  . No narrative on file    Review of Systems: Constitutional: Negative for fever, chills, diaphoresis, activity change, appetite change and fatigue. HENT: Negative for ear pain, nosebleeds, congestion, facial swelling, rhinorrhea, neck pain, neck stiffness and ear discharge.  Eyes: Negative for pain, discharge, redness, itching and visual disturbance. Respiratory: Negative for cough, choking, chest tightness, shortness of breath, wheezing and stridor.  Cardiovascular: Negative for chest pain, palpitations and leg swelling. Gastrointestinal: Negative for abdominal distention. Genitourinary: Negative for dysuria, urgency, frequency, hematuria, flank pain, decreased urine volume, difficulty urinating and dyspareunia.  Musculoskeletal: Negative for back pain, positive muscleach. Neurological: Negative for dizziness, tremors, seizures, syncope, facial asymmetry, speech difficulty, weakness, light-headedness, numbness and headaches.  Hematological: Negative for adenopathy. Does not bruise/bleed easily. Psychiatric/Behavioral: Negative for hallucinations, behavioral problems, confusion, dysphoric mood, decreased concentration and agitation.    Objective:   Filed Vitals:   07/25/13 1216  BP: 133/82  Pulse: 59  Temp: 99.3 F (37.4 C)  Resp: 16    Physical Exam: Constitutional: Patient  appears well-developed and well-nourished. No distress. HENT: Normocephalic, atraumatic, External right and left ear normal. Oropharynx is clear and moist.  Eyes: Conjunctivae and EOM are normal. PERRLA, no scleral icterus. Neck: Normal ROM. Neck supple. No JVD. No tracheal deviation. No thyromegaly. CVS: RRR, S1/S2 +, no murmurs, no gallops, no carotid bruit.  Pulmonary: Effort and  breath sounds normal, no stridor, rhonchi, wheezes, rales.  Abdominal: Soft. BS +, no distension, tenderness, rebound or guarding.  Musculoskeletal: Normal range of motion. Some tenderness on the trapezius muscles, equal strength both upper extremities, DTR 2+.  Neuro: Alert. Normal reflexes, muscle tone coordination. No cranial nerve deficit. Skin: Skin is warm and dry. No rash noted. Not diaphoretic. No erythema. No pallor. Psychiatric: Normal mood and affect. Behavior, judgment, thought content normal.  Lab Results  Component Value Date   WBC 5.8 04/23/2012   HGB 13.1 04/23/2012   HCT 36.8 04/23/2012   MCV 88.9 04/23/2012   PLT 243 04/23/2012   Lab Results  Component Value Date   CREATININE 0.99 04/23/2012   BUN 16 04/23/2012   NA 134* 04/23/2012   K 4.7 04/23/2012   CL 100 04/23/2012   CO2 24 04/23/2012    Lab Results  Component Value Date   HGBA1C 8.0 07/25/2013   Lipid Panel  No results found for this basename: chol, trig, hdl, cholhdl, vldl, ldlcalc       Assessment and plan:   1. Diabetes  - Glucose (CBG) 1 31 mg/dL - HgB A5W 8% - insulin NPH (HUMULIN N,NOVOLIN N) 100 UNIT/ML injection; Inject 20 Units into the skin 2 (two) times daily.  Dispense: 1 vial; Refill: 3 - insulin regular (NOVOLIN R,HUMULIN R) 100 units/mL injection; Inject 0.1 mLs (10 Units total) into the skin 2 (two) times daily.  Dispense: 10 mL; Refill: 3 - COMPLETE METABOLIC PANEL WITH GFR - CBC with Differential - Ambulatory referral to Ophthalmology  2. Muscle strain Will try - cyclobenzaprine (FLEXERIL) 10 MG tablet; Take 1 tablet (10 mg total) by mouth at bedtime.  Dispense: 30 tablet; Refill: 1 Also advised to apply heating pad .  3. Screening  - TSH - Lipid panel - Vit D  25 hydroxy (rtn osteoporosis monitoring)   Follow up in 6 weeks   Chavie Kolinski, Ayesha Rumpf, MD

## 2013-07-26 LAB — VITAMIN D 25 HYDROXY (VIT D DEFICIENCY, FRACTURES): Vit D, 25-Hydroxy: 19 ng/mL — ABNORMAL LOW (ref 30–89)

## 2013-08-10 ENCOUNTER — Encounter (HOSPITAL_COMMUNITY): Payer: Self-pay | Admitting: Emergency Medicine

## 2013-08-10 ENCOUNTER — Emergency Department (INDEPENDENT_AMBULATORY_CARE_PROVIDER_SITE_OTHER)
Admission: EM | Admit: 2013-08-10 | Discharge: 2013-08-10 | Disposition: A | Discharge status: Home / Self Care | Payer: Self-pay | Source: Home / Self Care | Attending: Emergency Medicine | Admitting: Emergency Medicine

## 2013-08-10 DIAGNOSIS — M7551 Bursitis of right shoulder: Secondary | ICD-10-CM

## 2013-08-10 DIAGNOSIS — M719 Bursopathy, unspecified: Secondary | ICD-10-CM

## 2013-08-10 DIAGNOSIS — M67919 Unspecified disorder of synovium and tendon, unspecified shoulder: Secondary | ICD-10-CM

## 2013-08-10 MED ORDER — MELOXICAM 7.5 MG PO TABS: 7.5000 mg | ORAL_TABLET | Freq: Every day | ORAL | Status: DC

## 2013-08-10 NOTE — ED Provider Notes (Signed)
CSN: [id number]     Arrival date & time 08/10/13  1944 History   First MD Initiated Contact with Patient 08/10/13 1951     Chief Complaint  Patient presents with  . Shoulder Pain  . Callouses   (Consider location/radiation/quality/duration/timing/severity/associated sxs/prior Treatment) HPI Comments: Symptoms typically exacerbated by repeated or heavy lifting. Works in Development worker, community at [employer]. Has tried using heating pad and muscle relaxer at home with little relief. Denies recent or remote injury or surgery. No changes in strength or sensation of either upper extremity.   Patient is a 43 y.o. female presenting with shoulder pain. The history is provided by the patient.  Shoulder Pain This is a recurrent (+intermittent bilateral shoulder pain x 2-3 months) problem.    Past Medical History  Diagnosis Date  . Diabetes mellitus    Past Surgical History  Procedure Laterality Date  . Cesarean section     Family History  Problem Relation Age of Onset  . Diabetes Mother   . Hypertension Mother   . Cancer Mother   . Diabetes Sister   . Hypertension Sister   . Hypertension Brother   . Diabetes Maternal Aunt    History  Substance Use Topics  . Smoking status: Current Some Day Smoker  . Smokeless tobacco: Not on file  . Alcohol Use: No     Comment: rare   OB History   Grav Para Term Preterm Abortions TAB SAB Ect Mult Living                 Review of Systems  All other systems reviewed and are negative.    Allergies  Review of patient's allergies indicates no known allergies.  Home Medications   Current Outpatient Rx  Name  Route  Sig  Dispense  Refill  . acetaminophen (TYLENOL) 500 MG tablet   Oral   Take 1,000 mg by mouth daily as needed for pain.         . cyclobenzaprine (FLEXERIL) 10 MG tablet   Oral   Take 1 tablet (10 mg total) by mouth at bedtime.   30 tablet   1   . insulin NPH (HUMULIN N,NOVOLIN N) 100 UNIT/ML injection   Subcutaneous   Inject 20  Units into the skin 2 (two) times daily.   1 vial   3   . insulin regular (NOVOLIN R,HUMULIN R) 100 units/mL injection   Subcutaneous   Inject 0.1 mLs (10 Units total) into the skin 2 (two) times daily.   10 mL   3   . Menthol-Methyl Salicylate (MUSCLE RUB) 10-15 % CREA   Topical   Apply 1 application topically as needed (for muscle pain).         . methocarbamol (ROBAXIN) 500 MG tablet   Oral   Take 1 tablet (500 mg total) by mouth 2 (two) times daily.   20 tablet   0   . naproxen (NAPROSYN) 500 MG tablet   Oral   Take 1 tablet (500 mg total) by mouth 2 (two) times daily.   30 tablet   0    BP 154/87  Pulse 72  Temp(Src) 98.5 F (36.9 C) (Oral)  Resp 18  SpO2 99%  LMP 07/23/2013 Physical Exam  Nursing note and vitals reviewed. Constitutional: She is oriented to person, place, and time. She appears well-developed and well-nourished. No distress.  HENT:  Head: Normocephalic and atraumatic.  Eyes: Conjunctivae are normal. No scleral icterus.  Neck: Normal range of motion  and full passive range of motion without pain. Neck supple. No muscular tenderness present.  Cardiovascular: Regular rhythm and normal heart sounds.   Pulmonary/Chest: Effort normal and breath sounds normal.  Musculoskeletal: Normal range of motion.       Right shoulder: She exhibits tenderness. She exhibits normal range of motion, no bony tenderness, no swelling, no effusion, no crepitus, no deformity, no laceration and normal strength.       Arms: CSM exam of right upper extremity intact  Neurological: She is alert and oriented to person, place, and time.  Skin: Skin is warm. No rash noted.  Psychiatric: She has a normal mood and affect. Her behavior is normal.    ED Course  Procedures (including critical care time) Labs Review Labs Reviewed - No data to display Imaging Review No results found.  EKG Interpretation    Date/Time:    Ventricular Rate:    PR Interval:    QRS Duration:    QT Interval:    QTC Calculation:   R Axis:     Text Interpretation:              MDM  Chronic right shoulder pain with acute exacerbation consistent with mild shoulder bursitis secondary to repeated heavy lifting at work. Will Rx mobic and RICE therapy. Patient also in need of new PCP as she has let her medicaid lapse for management of DM and HTN. Given referral info for community wellness clinic.     Jess BartersJennifer Lee CarlsbadPresson, [place]PA 08/10/13 2034  Jess BartersJennifer Lee Alto PassPresson, [place] 08/10/13 2059

## 2013-08-10 NOTE — ED Provider Notes (Signed)
Medical screening examination/treatment/procedure(s) were performed by non-physician practitioner and as supervising physician I was immediately available for consultation/collaboration.  Jonte Wollam, M.D.  Chastidy Ranker C Johney Perotti, MD 08/10/13 2102 

## 2013-08-10 NOTE — ED Notes (Signed)
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

## 2013-09-05 ENCOUNTER — Ambulatory Visit: Payer: Self-pay | Admitting: Internal Medicine

## 2013-09-13 ENCOUNTER — Ambulatory Visit: Payer: Self-pay

## 2013-12-30 IMAGING — CR DG CHEST 2V
2 series · 2 of 2 positions shown · non-contrast
Comparison: CT chest 12/25/2004.

CLINICAL DATA: 2-week history of chest pain.

CHEST - 2 VIEW

[w chest pa]
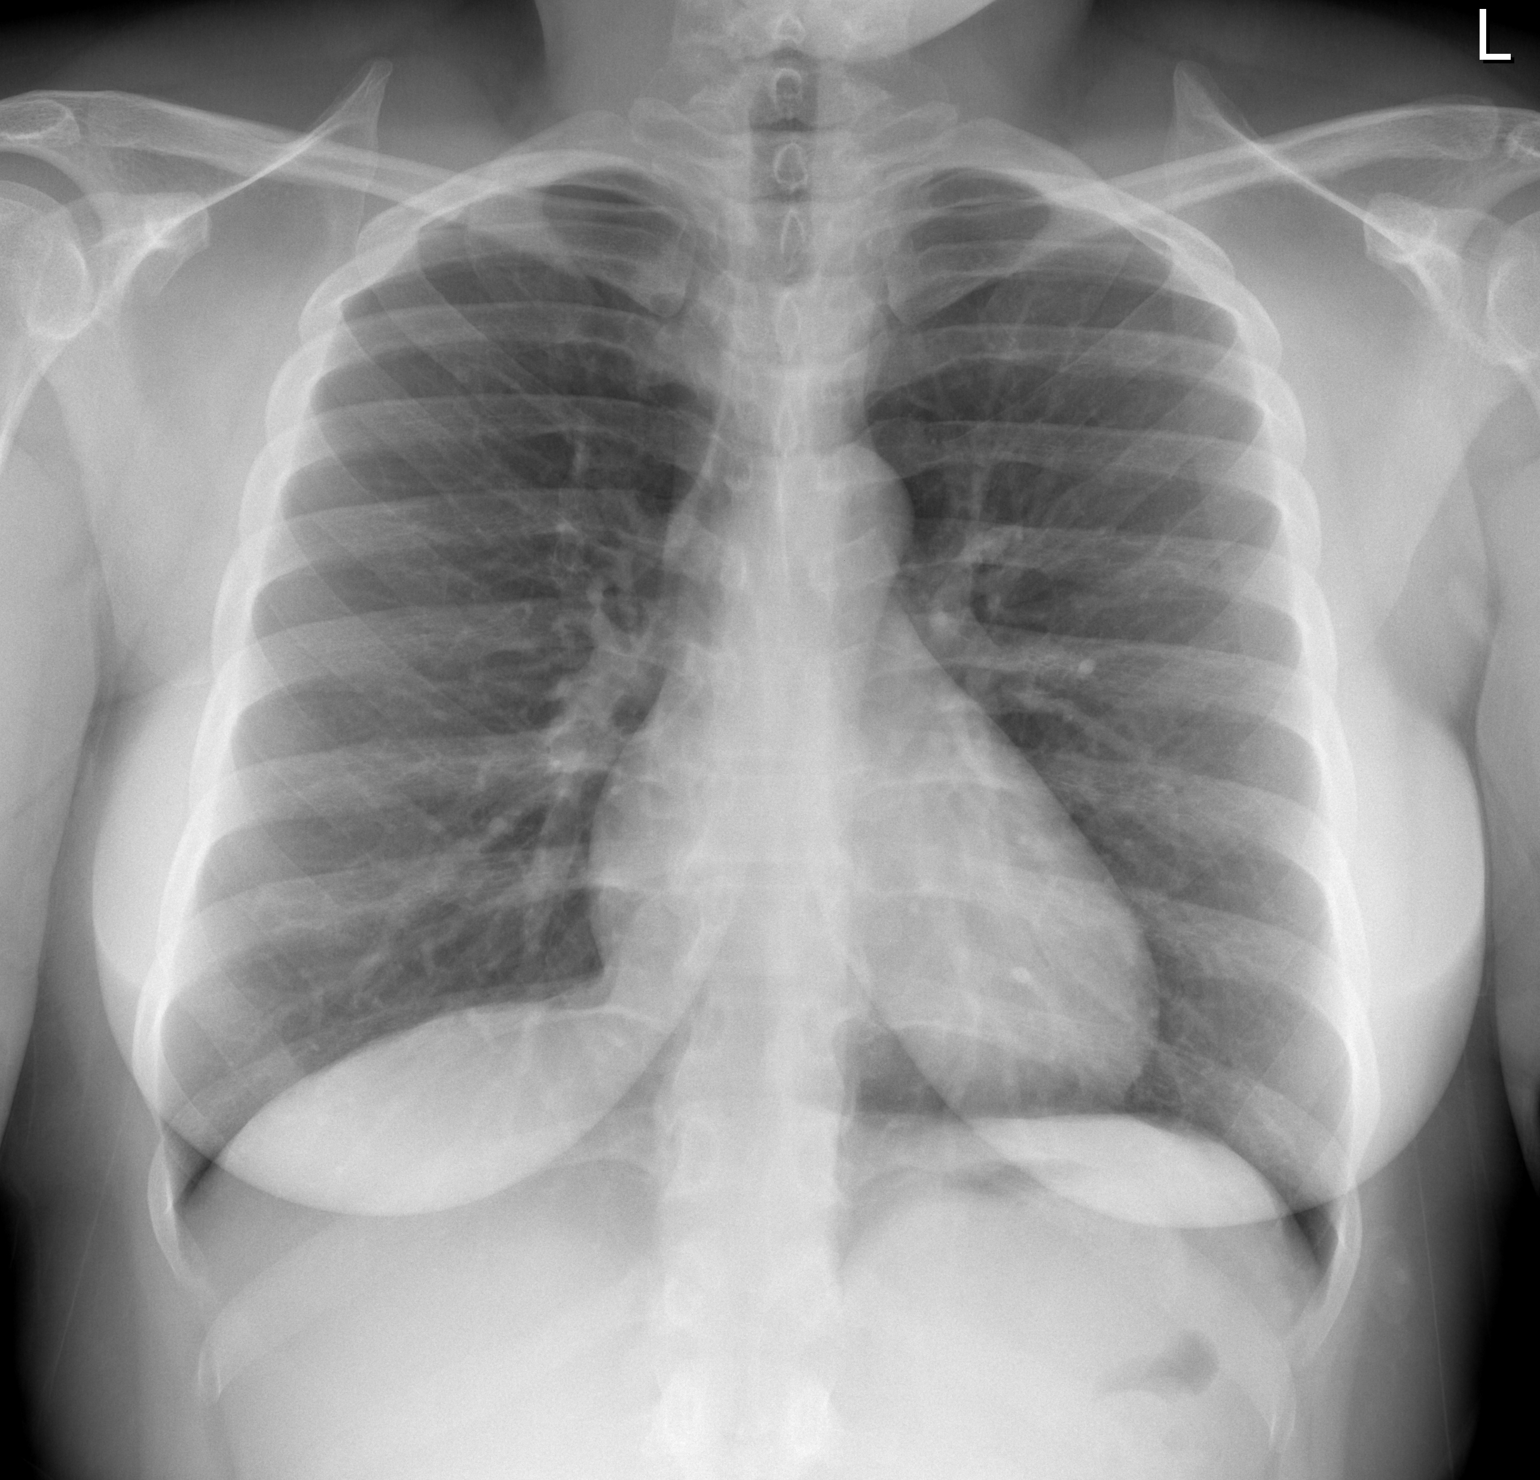

[w chest lat]
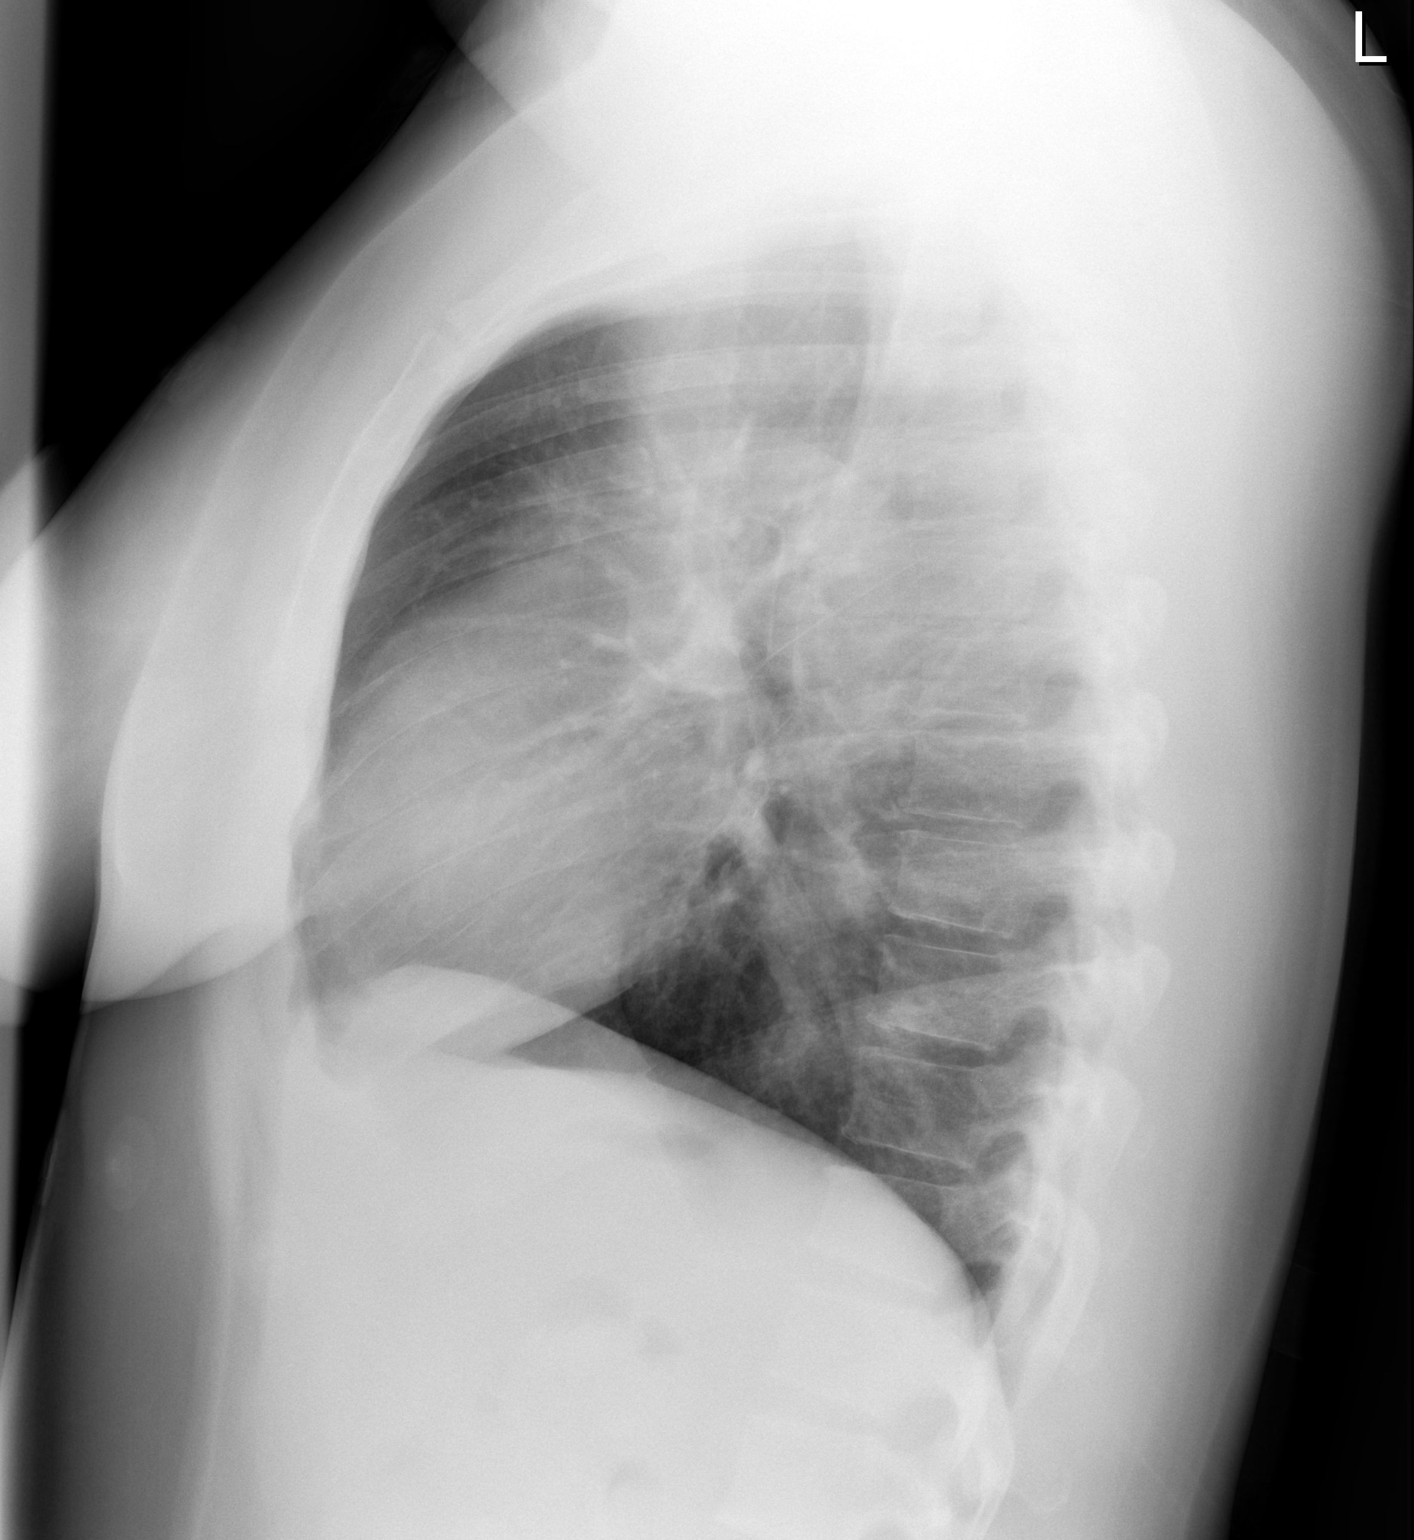

[2 of 2 positions shown; findings below may reference images not displayed]

FINDINGS: Cardiomediastinal silhouette unremarkable.  Lungs clear.
Bronchovascular markings normal.  Pulmonary vascularity normal.  No
pleural effusions.  No pneumothorax.  Visualized bony thorax
intact.
IMPRESSION: Normal examination.

## 2017-02-19 ENCOUNTER — Ambulatory Visit (HOSPITAL_COMMUNITY)
Admission: EM | Admit: 2017-02-19 | Discharge: 2017-02-19 | Disposition: A | Discharge status: Home / Self Care | Payer: BLUE CROSS/BLUE SHIELD

## 2017-02-19 ENCOUNTER — Encounter (HOSPITAL_COMMUNITY): Payer: Self-pay | Admitting: Emergency Medicine

## 2017-02-19 DIAGNOSIS — L03221 Cellulitis of neck: Secondary | ICD-10-CM | POA: Diagnosis not present

## 2017-02-19 DIAGNOSIS — R221 Localized swelling, mass and lump, neck: Secondary | ICD-10-CM

## 2017-02-19 DIAGNOSIS — R220 Localized swelling, mass and lump, head: Secondary | ICD-10-CM

## 2017-02-19 HISTORY — DX: Proteinuria, unspecified: R80.9

## 2017-02-19 MED ORDER — ACETAMINOPHEN 325 MG PO TABS
ORAL_TABLET | ORAL | Status: AC
  Filled 2017-02-19: qty 3

## 2017-02-19 MED ORDER — CEFTRIAXONE SODIUM 1 G IJ SOLR
INTRAMUSCULAR | Status: AC
  Filled 2017-02-19: qty 10

## 2017-02-19 MED ORDER — ACETAMINOPHEN 325 MG PO TABS
975.0000 mg | ORAL_TABLET | Freq: Once | ORAL | Status: AC
  Administered 2017-02-19: 975 mg via ORAL

## 2017-02-19 MED ORDER — CEFTRIAXONE SODIUM 1 G IJ SOLR
1.0000 g | Freq: Once | INTRAMUSCULAR | Status: AC
  Administered 2017-02-19: 1 g via INTRAMUSCULAR

## 2017-02-19 MED ORDER — DOXYCYCLINE HYCLATE 100 MG PO CAPS: 100.0000 mg | ORAL_CAPSULE | Freq: Two times a day (BID) | ORAL | 0 refills | Status: DC

## 2017-02-19 MED ORDER — HYDROCODONE-ACETAMINOPHEN 5-325 MG PO TABS: 2.0000 | ORAL_TABLET | ORAL | 0 refills | Status: DC | PRN

## 2017-02-19 NOTE — ED Provider Notes (Signed)
CSN: [id number]     Arrival date & time 02/19/17  1534 History   None    Chief Complaint  Patient presents with  . Abscess   (Consider location/radiation/quality/duration/timing/severity/associated sxs/prior Treatment) Patient is c/o neck pain and mass on back of neck and scalp.  She states she had a weave and thinks it may have infected her scalp.   The history is provided by the patient.  Abscess  Location:  Head/neck Size:  3 cm Abscess quality: induration, painful and warmth   Red streaking: no   Duration:  2 days Progression:  Worsening Pain details:    Quality:  Aching   Severity:  Moderate   Duration:  2 days   Timing:  Constant   Progression:  Worsening Chronicity:  New Relieved by:  Nothing Worsened by:  Nothing Ineffective treatments:  None tried   Past Medical History:  Diagnosis Date  . Diabetes mellitus   . Proteinuria    Past Surgical History:  Procedure Laterality Date  . CESAREAN SECTION     Family History  Problem Relation Age of Onset  . Diabetes Mother   . Hypertension Mother   . Cancer Mother   . Diabetes Sister   . Hypertension Sister   . Hypertension Brother   . Diabetes Maternal Aunt    Social History  Substance Use Topics  . Smoking status: Current Some Day Smoker  . Smokeless tobacco: Not on file  . Alcohol use No     Comment: rare   OB History    No data available     Review of Systems  Constitutional: Negative.   HENT: Negative.   Eyes: Negative.   Respiratory: Negative.   Cardiovascular: Negative.   Gastrointestinal: Negative.   Endocrine: Negative.   Genitourinary: Negative.   Musculoskeletal: Negative.   Skin: Positive for wound.  Allergic/Immunologic: Negative.   Neurological: Negative.     Allergies  Patient has no known allergies.  Home Medications   Prior to Admission medications   Medication Sig Start Date End Date Taking? Authorizing Provider  insulin NPH (HUMULIN N,NOVOLIN N) 100 UNIT/ML injection  Inject 20 Units into the skin 2 (two) times daily. 07/25/13  Yes Advani, Ayesha Rumpfeepak, MD  insulin regular (NOVOLIN R,HUMULIN R) 100 units/mL injection Inject 0.1 mLs (10 Units total) into the skin 2 (two) times daily. 07/25/13  Yes Advani, Ayesha Rumpfeepak, MD  LISINOPRIL PO Take by mouth.   Yes [provider]  oxyCODONE-acetaminophen (PERCOCET/ROXICET) 5-325 MG tablet Take by mouth every 4 (four) hours as needed for severe pain.   Yes [provider]  acetaminophen (TYLENOL) 500 MG tablet Take 1,000 mg by mouth daily as needed for pain.    [provider]  cyclobenzaprine (FLEXERIL) 10 MG tablet Take 1 tablet (10 mg total) by mouth at bedtime. 07/25/13   Doris CheadleAdvani, Deepak, MD  doxycycline (VIBRAMYCIN) 100 MG capsule Take 1 capsule (100 mg total) by mouth 2 (two) times daily. 02/19/17   Deatra Canterxford, William J, FNP  meloxicam (MOBIC) 7.5 MG tablet Take 1 tablet (7.5 mg total) by mouth daily. 08/10/13   Presson, Mathis FareJennifer Lee H, PA  Menthol-Methyl Salicylate (MUSCLE RUB) 10-15 % CREA Apply 1 application topically as needed (for muscle pain).    [provider]  methocarbamol (ROBAXIN) 500 MG tablet Take 1 tablet (500 mg total) by mouth 2 (two) times daily. 05/15/13   Santiago GladLaisure, Heather, PA-C  naproxen (NAPROSYN) 500 MG tablet Take 1 tablet (500 mg total) by mouth 2 (two)  times daily. 05/15/13   Santiago GladLaisure, Heather, PA-C   Meds Ordered and Administered this Visit   Medications  acetaminophen (TYLENOL) tablet 975 mg (not administered)  cefTRIAXone (ROCEPHIN) injection 1 g (not administered)    BP (!) 169/87 (BP Location: Left Arm)   Pulse 80   Temp 100 F (37.8 C) (Oral)   Resp 18   LMP 02/07/2017   SpO2 100%  No data found.   Physical Exam  Constitutional: She appears well-developed and well-nourished.  HENT:  Head: Normocephalic and atraumatic.  Eyes: Pupils are equal, round, and reactive to light. Conjunctivae and EOM are normal.  Neck: Normal range of motion. Neck supple.   Cardiovascular: Normal rate, regular rhythm and normal heart sounds.   Pulmonary/Chest: Effort normal and breath sounds normal.  Skin: Skin is warm. There is erythema.  Back of scalp and neck with erythema and nonfluctuant painful area with cellulitis  Nursing note and vitals reviewed.   Urgent Care Course     Procedures (including critical care time)  Labs Review Labs Reviewed - No data to display  Imaging Review No results found.   Visual Acuity Review  Right Eye Distance:   Left Eye Distance:   Bilateral Distance:    Right Eye Near:   Left Eye Near:    Bilateral Near:         MDM   1. Cellulitis of neck    Rocephin 1 gram IM Doxycycline 100mg  one po bid x 10 days  Tylenol 975mg  po now      Deatra CanterOxford, William J, FNP 02/19/17 1622

## 2017-02-19 NOTE — ED Triage Notes (Signed)
Throbbing pain at back of head/hairline.  There is a swollen area that patient noticed 2 days ago.  Prior to this had tight braids in hair

## 2017-02-23 ENCOUNTER — Encounter (HOSPITAL_COMMUNITY): Payer: Self-pay | Admitting: Emergency Medicine

## 2017-02-23 DIAGNOSIS — F17200 Nicotine dependence, unspecified, uncomplicated: Secondary | ICD-10-CM | POA: Diagnosis not present

## 2017-02-23 DIAGNOSIS — L739 Follicular disorder, unspecified: Secondary | ICD-10-CM | POA: Insufficient documentation

## 2017-02-23 DIAGNOSIS — Z794 Long term (current) use of insulin: Secondary | ICD-10-CM | POA: Diagnosis not present

## 2017-02-23 DIAGNOSIS — L02811 Cutaneous abscess of head [any part, except face]: Secondary | ICD-10-CM | POA: Diagnosis present

## 2017-02-23 DIAGNOSIS — E119 Type 2 diabetes mellitus without complications: Secondary | ICD-10-CM | POA: Diagnosis not present

## 2017-02-23 DIAGNOSIS — Z79899 Other long term (current) drug therapy: Secondary | ICD-10-CM | POA: Insufficient documentation

## 2017-02-23 NOTE — ED Triage Notes (Signed)
Patient reports skin abscess at posterior lower scalp area with mild drainage onset last week , denies fever or chills .

## 2017-02-24 ENCOUNTER — Emergency Department (HOSPITAL_COMMUNITY)
Admission: EM | Admit: 2017-02-24 | Discharge: 2017-02-24 | Disposition: A | Discharge status: Home / Self Care | Payer: BLUE CROSS/BLUE SHIELD | Attending: Emergency Medicine | Admitting: Emergency Medicine

## 2017-02-24 DIAGNOSIS — L739 Follicular disorder, unspecified: Secondary | ICD-10-CM

## 2017-02-24 DIAGNOSIS — L0291 Cutaneous abscess, unspecified: Secondary | ICD-10-CM

## 2017-02-24 MED ORDER — DOXYCYCLINE HYCLATE 100 MG PO CAPS: 100.0000 mg | ORAL_CAPSULE | Freq: Two times a day (BID) | ORAL | 0 refills | Status: DC

## 2017-02-24 MED ORDER — HYDROCODONE-ACETAMINOPHEN 5-325 MG PO TABS
1.0000 | ORAL_TABLET | Freq: Once | ORAL | Status: AC
  Administered 2017-02-24: 2 via ORAL
  Filled 2017-02-24: qty 2

## 2017-02-24 MED ORDER — LIDOCAINE-EPINEPHRINE (PF) 2 %-1:200000 IJ SOLN
10.0000 mL | Freq: Once | INTRAMUSCULAR | Status: AC
  Administered 2017-02-24: 10 mL
  Filled 2017-02-24: qty 20

## 2017-02-24 MED ORDER — HYDROCODONE-ACETAMINOPHEN 5-325 MG PO TABS: 1.0000 | ORAL_TABLET | Freq: Four times a day (QID) | ORAL | 0 refills | Status: DC | PRN

## 2017-02-24 NOTE — ED Notes (Signed)
Patient is A&Ox4.  No signs of distress noted.  Please see providers complete history and physical exam.  

## 2017-02-24 NOTE — ED Provider Notes (Signed)
[facility]-EMERGENCY DEPT Provider Note   CSN: [id number] Arrival date & time: 02/23/17  2135     History   Chief Complaint Chief Complaint  Patient presents with  . Abscess    Scalp    HPI Emily Clay is a 46 y.o. female.  Patient presents to the ED with a chief complaint of abscess.  She states that she had an infection in her hair line from tight braids that formed about a week ago.  She states that she was seen at urgent care and was given an antibiotic.  She states that she didn't get it filled.  She denies any fevers, chills, nausea, or vomiting.  She states that the pain is worsening.  She reports that she is diabetic, but states that he sugars are well controlled.  She states that she came to the ED tonight because of worsening symptoms.   The history is provided by the patient. No language interpreter was used.    Past Medical History:  Diagnosis Date  . Diabetes mellitus   . Proteinuria     Patient Active Problem List   Diagnosis Date Noted  . Diabetes (HCC) 07/25/2013  . Muscle strain 07/25/2013    Past Surgical History:  Procedure Laterality Date  . CESAREAN SECTION      OB History    No data available       Home Medications    Prior to Admission medications   Medication Sig Start Date End Date Taking? Authorizing Provider  acetaminophen (TYLENOL) 500 MG tablet Take 1,000 mg by mouth daily as needed for pain.    [provider]  cyclobenzaprine (FLEXERIL) 10 MG tablet Take 1 tablet (10 mg total) by mouth at bedtime. 07/25/13   Doris CheadleAdvani, Deepak, MD  doxycycline (VIBRAMYCIN) 100 MG capsule Take 1 capsule (100 mg total) by mouth 2 (two) times daily. 02/24/17   Roxy HorsemanBrowning, Ryden Wainer, PA-C  HYDROcodone-acetaminophen (NORCO/VICODIN) 5-325 MG tablet Take 1 tablet by mouth every 6 (six) hours as needed. 02/24/17   Roxy HorsemanBrowning, Kerianna Rawlinson, PA-C  insulin NPH (HUMULIN N,NOVOLIN N) 100 UNIT/ML injection Inject 20 Units into the skin 2 (two) times daily. 07/25/13    Doris CheadleAdvani, Deepak, MD  insulin regular (NOVOLIN R,HUMULIN R) 100 units/mL injection Inject 0.1 mLs (10 Units total) into the skin 2 (two) times daily. 07/25/13   Doris CheadleAdvani, Deepak, MD  LISINOPRIL PO Take by mouth.    [provider]  meloxicam (MOBIC) 7.5 MG tablet Take 1 tablet (7.5 mg total) by mouth daily. 08/10/13   Presson, Mathis FareJennifer Lee H, PA  Menthol-Methyl Salicylate (MUSCLE RUB) 10-15 % CREA Apply 1 application topically as needed (for muscle pain).    [provider]  methocarbamol (ROBAXIN) 500 MG tablet Take 1 tablet (500 mg total) by mouth 2 (two) times daily. 05/15/13   Santiago GladLaisure, Heather, PA-C  naproxen (NAPROSYN) 500 MG tablet Take 1 tablet (500 mg total) by mouth 2 (two) times daily. 05/15/13   Santiago GladLaisure, Heather, PA-C  oxyCODONE-acetaminophen (PERCOCET/ROXICET) 5-325 MG tablet Take by mouth every 4 (four) hours as needed for severe pain.    [provider]    Family History Family History  Problem Relation Age of Onset  . Diabetes Mother   . Hypertension Mother   . Cancer Mother   . Diabetes Sister   . Hypertension Sister   . Hypertension Brother   . Diabetes Maternal Aunt     Social History Social History  Substance Use Topics  . Smoking status: Current Some  Day Smoker  . Smokeless tobacco: Never Used  . Alcohol use No     Comment: rare     Allergies   Patient has no known allergies.   Review of Systems Review of Systems  All other systems reviewed and are negative.    Physical Exam Updated Vital Signs BP (!) 200/81   Pulse 85   Temp 98.8 F (37.1 C) (Oral)   Resp 16   LMP 02/07/2017 (Approximate)   SpO2 100%   Physical Exam  Constitutional: She is oriented to person, place, and time. She appears well-developed and well-nourished.  HENT:  Head: Normocephalic and atraumatic.  Folliculitis and 2x3 cm posterior scalp abscess  Eyes: Conjunctivae and EOM are normal.  Neck: Normal range of motion.  Cardiovascular: Normal rate.     Pulmonary/Chest: Effort normal.  Abdominal: She exhibits no distension.  Musculoskeletal: Normal range of motion.  Neurological: She is alert and oriented to person, place, and time.  Skin: Skin is dry.  Psychiatric: She has a normal mood and affect. Her behavior is normal. Judgment and thought content normal.  Nursing note and vitals reviewed.    ED Treatments / Results  Labs (all labs ordered are listed, but only abnormal results are displayed) Labs Reviewed - No data to display  EKG  EKG Interpretation None       Radiology No results found.  Procedures Procedures (including critical care time) INCISION AND DRAINAGE Performed by: Roxy HorsemanBROWNING, Jathen Sudano Consent: Verbal consent obtained. Risks and benefits: risks, benefits and alternatives were discussed Type: abscess  Body area: posterior scalp  Anesthesia: local infiltration  Incision was made with a scalpel.  Local anesthetic: lidocaine 1% with epinephrine  Anesthetic total: 5 ml  Complexity: complex Blunt dissection to break up loculations  Drainage: purulent  Drainage amount: moderate  Packing material: 1/4 in iodoform gauze  Patient tolerance: Patient tolerated the procedure well with no immediate complications.    Medications Ordered in ED Medications  HYDROcodone-acetaminophen (NORCO/VICODIN) 5-325 MG per tablet 1-2 tablet (not administered)  lidocaine-EPINEPHrine (XYLOCAINE W/EPI) 2 %-1:200000 (PF) injection 10 mL (10 mLs Infiltration Given 02/24/17 0355)     Initial Impression / Assessment and Plan / ED Course  I have reviewed the triage vital signs and the nursing notes.  Pertinent labs & imaging results that were available during my care of the patient were reviewed by me and considered in my medical decision making (see chart for details).     Patient with skin abscess amenable to incision and drainage.  Abscess was not large enough to warrant packing or drain,  wound recheck in 2 days.  Encouraged home warm soaks and flushing.  Mild signs of cellulitis is surrounding skin.  Will d/c to home.    Final Clinical Impressions(s) / ED Diagnoses   Final diagnoses:  Abscess  Folliculitis    New Prescriptions New Prescriptions   DOXYCYCLINE (VIBRAMYCIN) 100 MG CAPSULE    Take 1 capsule (100 mg total) by mouth 2 (two) times daily.   HYDROCODONE-ACETAMINOPHEN (NORCO/VICODIN) 5-325 MG TABLET    Take 1 tablet by mouth every 6 (six) hours as needed.     Roxy HorsemanBrowning, Dessa Ledee, PA-C 02/24/17 [id number]    Nicanor AlconPalumbo, April, MD 02/24/17 [phone]

## 2018-03-11 ENCOUNTER — Encounter (HOSPITAL_COMMUNITY): Payer: Self-pay | Admitting: Emergency Medicine

## 2018-03-11 ENCOUNTER — Ambulatory Visit (HOSPITAL_COMMUNITY)
Admission: EM | Admit: 2018-03-11 | Discharge: 2018-03-11 | Disposition: A | Discharge status: Home / Self Care | Payer: BLUE CROSS/BLUE SHIELD | Attending: Family Medicine | Admitting: Family Medicine

## 2018-03-11 ENCOUNTER — Other Ambulatory Visit: Payer: Self-pay

## 2018-03-11 DIAGNOSIS — Z760 Encounter for issue of repeat prescription: Secondary | ICD-10-CM

## 2018-03-11 DIAGNOSIS — I10 Essential (primary) hypertension: Secondary | ICD-10-CM

## 2018-03-11 DIAGNOSIS — S39012A Strain of muscle, fascia and tendon of lower back, initial encounter: Secondary | ICD-10-CM

## 2018-03-11 LAB — POCT I-STAT, CHEM 8
BUN: 19 mg/dL (ref 6–20)
CHLORIDE: 98 mmol/L (ref 98–111)
Calcium, Ion: 1.15 mmol/L (ref 1.15–1.40)
Creatinine, Ser: 0.9 mg/dL (ref 0.44–1.00)
Glucose, Bld: 384 mg/dL — ABNORMAL HIGH (ref 70–99)
HEMATOCRIT: 41 % (ref 36.0–46.0)
Hemoglobin: 13.9 g/dL (ref 12.0–15.0)
POTASSIUM: 4.2 mmol/L (ref 3.5–5.1)
SODIUM: 134 mmol/L — AB (ref 135–145)
TCO2: 24 mmol/L (ref 22–32)

## 2018-03-11 MED ORDER — LISINOPRIL 20 MG PO TABS: 20.0000 mg | ORAL_TABLET | Freq: Every day | ORAL | 0 refills | Status: DC

## 2018-03-11 MED ORDER — MELOXICAM 7.5 MG PO TABS: 7.5000 mg | ORAL_TABLET | Freq: Every day | ORAL | 0 refills | Status: DC

## 2018-03-11 MED ORDER — CYCLOBENZAPRINE HCL 5 MG PO TABS: 5.0000 mg | ORAL_TABLET | Freq: Every day | ORAL | 0 refills | Status: DC

## 2018-03-11 NOTE — ED Provider Notes (Signed)
[facility]    CSN: [id number] Arrival date & time: 03/11/18  1455     History   Chief Complaint Chief Complaint  Patient presents with  . Flank Pain    HPI Emily Clay is a 47 y.o. female.   Emily Clay presents with complaints of left low back pain which is worse with certain movements such as turning or bending. Has been worsening over the past week. Denies any previous similar. No specific trigger. 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 No urinary symptoms. No gi symptoms. Pain 9 /10. Has not taken any medications for symptoms. Did try a muscle rub which seemed to help. No radiation of pain to buttocks or legs, no numbness or tingling. Hx of dm and htn. Ran out of her lisinopril as she does not have a current PCP.    ROS per HPI.      Past Medical History:  Diagnosis Date  . Diabetes mellitus   . Proteinuria     Patient Active Problem List   Diagnosis Date Noted  . Diabetes (HCC) 07/25/2013  . Muscle strain 07/25/2013    Past Surgical History:  Procedure Laterality Date  . CESAREAN SECTION      OB History   None      Home Medications    Prior to Admission medications   Medication Sig Start Date End Date Taking? Authorizing Provider  cyclobenzaprine (FLEXERIL) 5 MG tablet Take 1 tablet (5 mg total) by mouth at bedtime. 03/11/18   Linus MakoBurky, Issaiah Seabrooks B, NP  insulin NPH (HUMULIN N,NOVOLIN N) 100 UNIT/ML injection Inject 20 Units into the skin 2 (two) times daily. 07/25/13   Doris CheadleAdvani, Deepak, MD  insulin regular (NOVOLIN R,HUMULIN R) 100 units/mL injection Inject 0.1 mLs (10 Units total) into the skin 2 (two) times daily. 07/25/13   Doris CheadleAdvani, Deepak, MD  lisinopril (PRINIVIL,ZESTRIL) 20 MG tablet Take 1 tablet (20 mg total) by mouth daily. 03/11/18   Georgetta HaberBurky, Jun Osment B, NP  meloxicam (MOBIC) 7.5 MG tablet Take 1 tablet (7.5 mg total) by mouth daily. 03/11/18   Georgetta HaberBurky, Tabita Corbo B, NP    Family History Family History  Problem  Relation Age of Onset  . Diabetes Mother   . Hypertension Mother   . Cancer Mother   . Diabetes Sister   . Hypertension Sister   . Hypertension Brother   . Diabetes Maternal Aunt     Social History Social History   Tobacco Use  . Smoking status: Current Some Day Smoker  . Smokeless tobacco: Never Used  Substance Use Topics  . Alcohol use: No    Comment: rare  . Drug use: No     Allergies   Patient has no known allergies.   Review of Systems Review of Systems   Physical Exam Triage Vital Signs ED Triage Vitals  Enc Vitals Group     BP 03/11/18 1535 (!) 151/73     Pulse Rate 03/11/18 1535 76     Resp 03/11/18 1535 18     Temp 03/11/18 1535 98.2 F (36.8 C)     Temp src --      SpO2 03/11/18 1535 100 %     Weight 03/11/18 1534 174 lb (78.9 kg)     Height --      Head Circumference --      Peak Flow --      Pain Score --      Pain Loc --  Pain Edu? --      Excl. in GC? --    No data found.  Updated Vital Signs BP (!) 151/73   Pulse 76   Temp 98.2 F (36.8 C)   Resp 18   Wt 174 lb (78.9 kg)   LMP 02/27/2018   SpO2 100%   BMI 32.88 kg/m   Physical Exam  Constitutional: She is oriented to person, place, and time. She appears well-developed and well-nourished. No distress.  Cardiovascular: Normal rate, regular rhythm and normal heart sounds.  Pulmonary/Chest: Effort normal and breath sounds normal.  Abdominal: There is no CVA tenderness.  Musculoskeletal:       Lumbar back: She exhibits tenderness and pain. She exhibits normal range of motion, no bony tenderness, no swelling, no edema, no deformity, no laceration, no spasm and normal pulse.       Back:  Left low back pain on palpation; no bony or spinous process tenderness; no pain with bilateral hip flexion or straight leg raise; strength equal bilaterally; gross sensation intact; ambulatory without difficulty   Neurological: She is alert and oriented to person, place, and time.  Skin: Skin is  warm and dry.     UC Treatments / Results  Labs (all labs ordered are listed, but only abnormal results are displayed) Labs Reviewed  POCT I-STAT, CHEM 8 - Abnormal; Notable for the following components:      Result Value   Sodium 134 (*)    Glucose, Bld 384 (*)    All other components within normal limits    EKG None  Radiology No results found.  Procedures Procedures (including critical care time)  Medications Ordered in UC Medications - No data to display  Initial Impression / Assessment and Plan / UC Course  I have reviewed the triage vital signs and the nursing notes.  Pertinent labs & imaging results that were available during my care of the patient were reviewed by me and considered in my medical decision making (see chart for details).     History and physical consistent with low back strain. meloxicam daily, flexeril at night. Activity as tolerated. Lisinopril refilled for 1 month. Encouraged PCP establish for long term management. Patient verbalized understanding and agreeable to plan.   Final Clinical Impressions(s) / UC Diagnoses   Final diagnoses:  Strain of lumbar region, initial encounter  Medication refill  Essential hypertension     Discharge Instructions     Light and regular activity as tolerated.  Meloxicam daily, take with food. Don't take additional ibuprofen or naproxen (aleve).  See exercises provided.  Muscle relaxer at night. May cause drowsiness. Please do not take if driving or drinking alcohol.   Please establish with a primary care provider for blood pressure management.     ED Prescriptions    Medication Sig Dispense Auth. Provider   lisinopril (PRINIVIL,ZESTRIL) 20 MG tablet Take 1 tablet (20 mg total) by mouth daily. 30 tablet Linus MakoBurky, Alease Fait B, NP   meloxicam (MOBIC) 7.5 MG tablet Take 1 tablet (7.5 mg total) by mouth daily. 20 tablet Linus MakoBurky, Ehtan Delfavero B, NP   cyclobenzaprine (FLEXERIL) 5 MG tablet Take 1 tablet (5 mg total) by  mouth at bedtime. 15 tablet Georgetta HaberBurky, Sharnay Cashion B, NP     Controlled Substance Prescriptions [place] Controlled Substance Registry consulted? Not Applicable   Georgetta HaberBurky, Sion Thane B, NP 03/11/18 1622

## 2018-03-11 NOTE — Discharge Instructions (Signed)
Light and regular activity as tolerated.  Meloxicam daily, take with food. Don't take additional ibuprofen or naproxen (aleve).  See exercises provided.  Muscle relaxer at night. May cause drowsiness. Please do not take if driving or drinking alcohol.   Please establish with a primary care provider for blood pressure management.

## 2018-03-11 NOTE — ED Triage Notes (Signed)
Pt has left side flank pain and back pain.

## 2019-07-06 ENCOUNTER — Other Ambulatory Visit: Payer: Self-pay

## 2019-07-06 ENCOUNTER — Ambulatory Visit (HOSPITAL_COMMUNITY)
Admission: EM | Admit: 2019-07-06 | Discharge: 2019-07-06 | Disposition: A | Discharge status: Home / Self Care | Payer: Self-pay | Attending: Family Medicine | Admitting: Family Medicine

## 2019-07-06 DIAGNOSIS — Z794 Long term (current) use of insulin: Secondary | ICD-10-CM | POA: Insufficient documentation

## 2019-07-06 DIAGNOSIS — Z3202 Encounter for pregnancy test, result negative: Secondary | ICD-10-CM

## 2019-07-06 DIAGNOSIS — E119 Type 2 diabetes mellitus without complications: Secondary | ICD-10-CM

## 2019-07-06 DIAGNOSIS — I10 Essential (primary) hypertension: Secondary | ICD-10-CM | POA: Insufficient documentation

## 2019-07-06 DIAGNOSIS — Z760 Encounter for issue of repeat prescription: Secondary | ICD-10-CM | POA: Insufficient documentation

## 2019-07-06 DIAGNOSIS — R109 Unspecified abdominal pain: Secondary | ICD-10-CM

## 2019-07-06 LAB — BASIC METABOLIC PANEL
Anion gap: 9 (ref 5–15)
BUN: 11 mg/dL (ref 6–20)
CO2: 25 mmol/L (ref 22–32)
Calcium: 9.3 mg/dL (ref 8.9–10.3)
Chloride: 100 mmol/L (ref 98–111)
Creatinine, Ser: 0.79 mg/dL (ref 0.44–1.00)
GFR calc Af Amer: >60 mL/min (ref >60)
GFR calc non Af Amer: >60 mL/min (ref >60)
Glucose, Bld: 188 mg/dL — ABNORMAL HIGH (ref 70–99)
Potassium: 4.1 mmol/L (ref 3.5–5.1)
Sodium: 134 mmol/L — ABNORMAL LOW (ref 135–145)

## 2019-07-06 LAB — POCT URINALYSIS DIP (DEVICE)
Bilirubin Urine: NEGATIVE
Glucose, UA: 500 mg/dL — AB
Hgb urine dipstick: NEGATIVE
Ketones, ur: 15 mg/dL — AB
Leukocytes,Ua: NEGATIVE
Nitrite: NEGATIVE
Protein, ur: 100 mg/dL — AB
Specific Gravity, Urine: >=1.03 (ref 1.005–1.030)
Urobilinogen, UA: 0.2 mg/dL (ref 0.0–1.0)
pH: 6 (ref 5.0–8.0)

## 2019-07-06 LAB — GLUCOSE, CAPILLARY: Glucose-Capillary: 145 mg/dL — ABNORMAL HIGH (ref 70–99)

## 2019-07-06 LAB — POC URINE PREG, ED: Preg Test, Ur: NEGATIVE

## 2019-07-06 LAB — CBG MONITORING, ED: Glucose-Capillary: 145 mg/dL — ABNORMAL HIGH (ref 70–99)

## 2019-07-06 LAB — HEMOGLOBIN A1C
Hgb A1c MFr Bld: 10.3 % — ABNORMAL HIGH (ref 4.8–5.6)
Mean Plasma Glucose: 248.91 mg/dL

## 2019-07-06 LAB — POCT PREGNANCY, URINE: Preg Test, Ur: NEGATIVE

## 2019-07-06 MED ORDER — INSULIN NPH (HUMAN) (ISOPHANE) 100 UNIT/ML ~~LOC~~ SUSP: 20.0000 [IU] | Freq: Two times a day (BID) | SUBCUTANEOUS | 0 refills | Status: DC

## 2019-07-06 MED ORDER — INSULIN REGULAR HUMAN 100 UNIT/ML IJ SOLN: 10.0000 [IU] | Freq: Two times a day (BID) | INTRAMUSCULAR | 0 refills | Status: DC

## 2019-07-06 MED ORDER — LISINOPRIL 20 MG PO TABS: 20.0000 mg | ORAL_TABLET | Freq: Every day | ORAL | 0 refills | Status: DC

## 2019-07-06 MED ORDER — CYCLOBENZAPRINE HCL 5 MG PO TABS: 5.0000 mg | ORAL_TABLET | Freq: Every day | ORAL | 0 refills | Status: DC

## 2019-07-06 MED ORDER — DICLOFENAC SODIUM 1 % EX GEL: 2.0000 g | Freq: Four times a day (QID) | CUTANEOUS | 0 refills | Status: DC

## 2019-07-06 MED ORDER — ACETAMINOPHEN 500 MG PO TABS: 1000.0000 mg | ORAL_TABLET | Freq: Three times a day (TID) | ORAL | 0 refills | Status: DC | PRN

## 2019-07-06 NOTE — ED Provider Notes (Signed)
[facility]    CSN: [id number] Arrival date & time: 07/06/19  1346      History   Chief Complaint Chief Complaint  Patient presents with   Abdominal Pain    HPI Emily Clay is a 48 y.o. female.   Emily Clay presents with complaints of left flank pain which started approximately 2 weeks ago but has been worsening over the past few days and is now constant. Certain movements do seem to to worsen it but it is present even at rest. Pain 8/10 in severity. Sometimes has RLQ shooting, cramping pain as well. No pain with urination or frequency, but at times feels she only urinates small amounts. No blood to urine. No fevers. Has had kidney infections in the past which had some similar features. No previous kidney stones. No injury to back, has been out of work for a few weeks now. No vaginal symptoms. No other gi symptoms. Hasn't taken any medications for pain. LMP 11/15. Out of her BP medication for months now. No leg swelling, chest pain , headache or dizziness. Hasn't followed with a PCP.     ROS per HPI, negative if not otherwise mentioned.      Past Medical History:  Diagnosis Date   Diabetes mellitus    Proteinuria     Patient Active Problem List   Diagnosis Date Noted   Diabetes ([place]) 07/25/2013   Muscle strain 07/25/2013    Past Surgical History:  Procedure Laterality Date   CESAREAN SECTION      OB History   No obstetric history on file.      Home Medications    Prior to Admission medications   Medication Sig Start Date End Date Taking? Authorizing Provider  acetaminophen (TYLENOL) 500 MG tablet Take 2 tablets (1,000 mg total) by mouth every 8 (eight) hours as needed. 07/06/19   Zigmund Gottron, NP  cyclobenzaprine (FLEXERIL) 5 MG tablet Take 1 tablet (5 mg total) by mouth at bedtime. 07/06/19   Augusto Gamble B, NP  diclofenac Sodium (VOLTAREN) 1 % GEL Apply 2 g topically 4 (four) times daily. 07/06/19   Zigmund Gottron, NP  insulin  NPH Human (NOVOLIN N) 100 UNIT/ML injection Inject 0.2 mLs (20 Units total) into the skin 2 (two) times daily. 07/06/19   Augusto Gamble B, NP  insulin regular (NOVOLIN R) 100 units/mL injection Inject 0.1 mLs (10 Units total) into the skin 2 (two) times daily. 07/06/19   Augusto Gamble B, NP  lisinopril (ZESTRIL) 20 MG tablet Take 1 tablet (20 mg total) by mouth daily. 07/06/19   Zigmund Gottron, NP  meloxicam (MOBIC) 7.5 MG tablet Take 1 tablet (7.5 mg total) by mouth daily. 03/11/18   Zigmund Gottron, NP    Family History Family History  Problem Relation Age of Onset   Diabetes Mother    Hypertension Mother    Cancer Mother    Diabetes Sister    Hypertension Sister    Hypertension Brother    Diabetes Maternal Aunt     Social History Social History   Tobacco Use   Smoking status: Current Some Day Smoker   Smokeless tobacco: Never Used  Substance Use Topics   Alcohol use: No    Comment: rare   Drug use: No     Allergies   Patient has no known allergies.   Review of Systems Review of Systems   Physical Exam Triage Vital Signs ED Triage Vitals  Enc Vitals  Group     BP 07/06/19 1521 (!) 183/81     Pulse Rate 07/06/19 1521 72     Resp 07/06/19 1521 20     Temp 07/06/19 1521 98.5 F (36.9 C)     Temp Source 07/06/19 1521 Oral     SpO2 07/06/19 1521 99 %     Weight --      Height --      Head Circumference --      Peak Flow --      Pain Score 07/06/19 1522 8     Pain Loc --      Pain Edu? --      Excl. in GC? --    No data found.  Updated Vital Signs BP (!) 183/81 (BP Location: Left Arm)    Pulse 72    Temp 98.5 F (36.9 C) (Oral)    Resp 20    LMP 06/13/2019    SpO2 99%    Physical Exam Constitutional:      General: She is not in acute distress.    Appearance: She is well-developed.  Cardiovascular:     Rate and Rhythm: Normal rate.     Heart sounds: Normal heart sounds.  Pulmonary:     Effort: Pulmonary effort is normal.  Abdominal:      Tenderness: There is abdominal tenderness in the right lower quadrant and suprapubic area. There is no right CVA tenderness, left CVA tenderness, guarding or rebound.       Comments: Left lateral abdomen with tenderness on palpation, mild left mid back tenderness on palpation   Skin:    General: Skin is warm and dry.  Neurological:     Mental Status: She is alert and oriented to person, place, and time.      UC Treatments / Results  Labs (all labs ordered are listed, but only abnormal results are displayed) Labs Reviewed  GLUCOSE, CAPILLARY - Abnormal; Notable for the following components:      Result Value   Glucose-Capillary 145 (*)    All other components within normal limits  POCT URINALYSIS DIP (DEVICE) - Abnormal; Notable for the following components:   Glucose, UA 500 (*)    Ketones, ur 15 (*)    Protein, ur 100 (*)    All other components within normal limits  CBG MONITORING, ED - Abnormal; Notable for the following components:   Glucose-Capillary 145 (*)    All other components within normal limits  BASIC METABOLIC PANEL  HEMOGLOBIN A1C  POC URINE PREG, ED  POCT PREGNANCY, URINE    EKG   Radiology No results found.  Procedures Procedures (including critical care time)  Medications Ordered in UC Medications - No data to display  Initial Impression / Assessment and Plan / UC Course  I have reviewed the triage vital signs and the nursing notes.  Pertinent labs & imaging results that were available during my care of the patient were reviewed by me and considered in my medical decision making (see chart for details).     Urine without hgb, nitrite or leukocytes. Pain is reproducible to palpation, more consistent with muscular strain. Pain management discused. Patient has had limited resources due to job loss so has been using insulin only every other day, and not taking bp medication. 15 ketones to urine with 500 sugar as well. Glucose 145 here today. a1c  collected as well as bmp. Diet recommendations provided and insulin and bp medications refilled. Options for PCPs discussed.  Return precautions provided. Patient verbalized understanding and agreeable to plan.   Final Clinical Impressions(s) / UC Diagnoses   Final diagnoses:  Left flank pain  Medication refill     Discharge Instructions     I do not see any indication of bladder or kidney infection today.  Push fluids to ensure adequate hydration and to urinate regularly.  I have refilled your insulin to manage your blood sugar, continue to monitor your diet as well.  Please restart your blood pressure medication. Tylenol 1000mg  every 8 hours as needed for pain, topical voltaren as needed to help with pain, and muscle relaxer at night as needed.  If pain significantly worsens, develop fever, chills, or otherwise worsening please go to the ER.  Please establish with a primary care provider for long term management.     ED Prescriptions    Medication Sig Dispense Auth. Provider   cyclobenzaprine (FLEXERIL) 5 MG tablet Take 1 tablet (5 mg total) by mouth at bedtime. 15 tablet Merion Caton B, NP   insulin NPH Human (NOVOLIN N) 100 UNIT/ML injection Inject 0.2 mLs (20 Units total) into the skin 2 (two) times daily. 10 mL Linus MakoBurky, Raynie Steinhaus B, NP   insulin regular (NOVOLIN R) 100 units/mL injection Inject 0.1 mLs (10 Units total) into the skin 2 (two) times daily. 10 mL Linus MakoBurky, [place] Dodgen B, NP   diclofenac Sodium (VOLTAREN) 1 % GEL Apply 2 g topically 4 (four) times daily. 150 g Linus MakoBurky, Kayelee Herbig B, NP   acetaminophen (TYLENOL) 500 MG tablet Take 2 tablets (1,000 mg total) by mouth every 8 (eight) hours as needed. 30 tablet Linus MakoBurky, Danetta Prom B, NP   lisinopril (ZESTRIL) 20 MG tablet Take 1 tablet (20 mg total) by mouth daily. 30 tablet Georgetta HaberBurky, Brizza Nathanson B, NP     PDMP not reviewed this encounter.   Georgetta HaberBurky, Coburn Knaus B, NP 07/06/19 1616

## 2019-07-06 NOTE — Discharge Instructions (Signed)
I do not see any indication of bladder or kidney infection today.  Push fluids to ensure adequate hydration and to urinate regularly.  I have refilled your insulin to manage your blood sugar, continue to monitor your diet as well.  Please restart your blood pressure medication. Tylenol 1000mg  every 8 hours as needed for pain, topical voltaren as needed to help with pain, and muscle relaxer at night as needed.  If pain significantly worsens, develop fever, chills, or otherwise worsening please go to the ER.  Please establish with a primary care provider for long term management.

## 2019-07-06 NOTE — ED Triage Notes (Signed)
Pt c/o abd pain onset 2 weeks ago and radiates to left flank  Denies fevers  A&O x4... no acute distress.

## 2019-12-18 ENCOUNTER — Encounter (HOSPITAL_COMMUNITY): Payer: Self-pay

## 2019-12-18 ENCOUNTER — Ambulatory Visit (HOSPITAL_COMMUNITY)
Admission: EM | Admit: 2019-12-18 | Discharge: 2019-12-18 | Disposition: A | Discharge status: Home / Self Care | Payer: Medicaid Other | Attending: Emergency Medicine | Admitting: Emergency Medicine

## 2019-12-18 ENCOUNTER — Other Ambulatory Visit: Payer: Self-pay

## 2019-12-18 DIAGNOSIS — R070 Pain in throat: Secondary | ICD-10-CM

## 2019-12-18 MED ORDER — LIDOCAINE VISCOUS HCL 2 % MT SOLN: 15.0000 mL | OROMUCOSAL | 0 refills | Status: DC | PRN

## 2019-12-18 NOTE — ED Provider Notes (Signed)
Emily Clay    CSN: [id number] Arrival date & time: 12/18/19  1041      History   Chief Complaint Chief Complaint  Patient presents with  . Sore Throat    HPI Emily Clay is a 49 y.o. female. presenting with sore throat She reports last week eating shrimp and the tail seemed to get stuck in her throat. Since then she has pain every time she swallows as if something is scratching her throat. Otherwise she does not feel sick. Denies fever, cough, congestion,or ear pain. She has tried OTC throat spray without much relief. Sodas worsen pain. Eating soft white bread improved pain.  HPI  Past Medical History:  Diagnosis Date  . Diabetes mellitus   . Proteinuria     Patient Active Problem List   Diagnosis Date Noted  . Diabetes ([place]) 07/25/2013  . Muscle strain 07/25/2013    Past Surgical History:  Procedure Laterality Date  . CESAREAN SECTION      OB History   No obstetric history on file.      Home Medications    Prior to Admission medications   Medication Sig Start Date End Date Taking? Authorizing Provider  acetaminophen (TYLENOL) 500 MG tablet Take 2 tablets (1,000 mg total) by mouth every 8 (eight) hours as needed. 07/06/19   Zigmund Gottron, NP  cyclobenzaprine (FLEXERIL) 5 MG tablet Take 1 tablet (5 mg total) by mouth at bedtime. 07/06/19   Augusto Gamble B, NP  diclofenac Sodium (VOLTAREN) 1 % GEL Apply 2 g topically 4 (four) times daily. 07/06/19   Zigmund Gottron, NP  insulin NPH Human (NOVOLIN N) 100 UNIT/ML injection Inject 0.2 mLs (20 Units total) into the skin 2 (two) times daily. 07/06/19   Augusto Gamble B, NP  insulin regular (NOVOLIN R) 100 units/mL injection Inject 0.1 mLs (10 Units total) into the skin 2 (two) times daily. 07/06/19   Zigmund Gottron, NP  lidocaine (XYLOCAINE) 2 % solution Use as directed 15 mLs in the mouth or throat every 4 (four) hours as needed for mouth pain. 12/18/19   Domingo Dimes, PA-C  lisinopril (ZESTRIL)  20 MG tablet Take 1 tablet (20 mg total) by mouth daily. 07/06/19   Zigmund Gottron, NP  meloxicam (MOBIC) 7.5 MG tablet Take 1 tablet (7.5 mg total) by mouth daily. 03/11/18   Zigmund Gottron, NP    Family History Family History  Problem Relation Age of Onset  . Diabetes Mother   . Hypertension Mother   . Cancer Mother   . Diabetes Sister   . Hypertension Sister   . Hypertension Brother   . Diabetes Maternal Aunt     Social History Social History   Tobacco Use  . Smoking status: Current Some Day Smoker    Types: Cigarettes  . Smokeless tobacco: Never Used  Substance Use Topics  . Alcohol use: No    Comment: rare  . Drug use: No     Allergies   Patient has no known allergies.   Review of Systems Review of Systems  Constitutional: Negative for chills and fever.  HENT: Positive for trouble swallowing. Negative for mouth sores, rhinorrhea, sinus pressure and sore throat.   Respiratory: Negative for cough, shortness of breath and wheezing.   Cardiovascular: Negative for chest pain.  Gastrointestinal: Negative for abdominal pain, diarrhea, nausea and vomiting.  Genitourinary: Negative for difficulty urinating.  Musculoskeletal: Negative for myalgias.  Neurological: Negative for dizziness, weakness and headaches.  All other systems reviewed and are negative.    Physical Exam Triage Vital Signs ED Triage Vitals  Enc Vitals Group     BP 12/18/19 1109 (!) 175/84     Pulse Rate 12/18/19 1109 71     Resp 12/18/19 1109 18     Temp 12/18/19 1109 98.5 F (36.9 C)     Temp Source 12/18/19 1109 Oral     SpO2 12/18/19 1109 100 %     Weight 12/18/19 1110 175 lb (79.4 kg)     Height 12/18/19 1110 5\' 1"  (1.549 m)     Head Circumference --      Peak Flow --      Pain Score 12/18/19 1110 0     Pain Loc --      Pain Edu? --      Excl. in GC? --    No data found.  Updated Vital Signs BP (!) 175/84   Pulse 71   Temp 98.5 F (36.9 C) (Oral)   Resp 18   Ht 5\' 1"   (1.549 m)   Wt 175 lb (79.4 kg)   SpO2 100%   BMI 33.07 kg/m   Visual Acuity Right Eye Distance:   Left Eye Distance:   Bilateral Distance:    Right Eye Near:   Left Eye Near:    Bilateral Near:     Physical Exam Vitals reviewed.  Constitutional:      General: She is not in acute distress.    Appearance: She is not ill-appearing.  HENT:     Head: Normocephalic and atraumatic.     Right Ear: Tympanic membrane and ear canal normal. Tympanic membrane is not erythematous.     Left Ear: Tympanic membrane and ear canal normal. Tympanic membrane is not erythematous.     Nose: No congestion or rhinorrhea.     Mouth/Throat:     Mouth: Mucous membranes are moist. Mucous membranes are pale. No oral lesions.     Pharynx: Oropharynx is clear. No oropharyngeal exudate, posterior oropharyngeal erythema or uvula swelling.     Tonsils: No tonsillar exudate or tonsillar abscesses.  Eyes:     Conjunctiva/sclera: Conjunctivae normal.     Pupils: Pupils are equal, round, and reactive to light.  Neck:     Thyroid: No thyromegaly.  Pulmonary:     Effort: Pulmonary effort is normal. No respiratory distress.     Breath sounds: Normal breath sounds. No wheezing.  Abdominal:     General: There is no distension.     Palpations: Abdomen is soft.  Musculoskeletal:     Cervical back: Normal range of motion and neck supple.  Lymphadenopathy:     Cervical: No cervical adenopathy.  Skin:    General: Skin is warm.     Coloration: Skin is not pale.     Findings: No erythema.  Neurological:     Mental Status: She is alert.  Psychiatric:        Mood and Affect: Mood normal.        Behavior: Behavior normal.        Initial Impression / Assessment and Plan / UC Course  I have reviewed the triage vital signs and the nursing notes.  Pertinent labs & imaging results that were available during my care of the patient were reviewed by me and considered in my medical decision making (see chart for  details).     Throat pain. I recommended magic mouthwash for pain and follow-up with ENT  for a scope if pain continues. She also would like a referral to establish PCP care. Reports irregular periods and she has been told her blood is thick during lab draws. She does take a  baby aspirin. She does not have a bleeding disorder or a family history of such. I have low suspicion for blood clotting disorders but felt further evaluation with a PCP may help alleviate her anxiety regarding this.    Final Clinical Impressions(s) / UC Diagnoses   Final diagnoses:  Throat pain     Discharge Instructions     Please use viscous lidocaine to help with pain until you can follow-up with ENT.     ED Prescriptions    Medication Sig Dispense Auth. Provider   lidocaine (XYLOCAINE) 2 % solution Use as directed 15 mLs in the mouth or throat every 4 (four) hours as needed for mouth pain. 100 mL Chilton Si, PA-C     PDMP not reviewed this encounter.   Chilton Si, PA-C 12/18/19 1150

## 2019-12-18 NOTE — Discharge Instructions (Signed)
Please use viscous lidocaine to help with pain until you can follow-up with ENT.

## 2019-12-18 NOTE — ED Triage Notes (Signed)
Pt states ate shrimp a week ago including the tails and a day later felt like something has been stuck in her throat. Pt states throat is sore when she drinks liquids.

## 2020-01-27 ENCOUNTER — Other Ambulatory Visit: Payer: Self-pay | Admitting: Internal Medicine

## 2020-01-27 ENCOUNTER — Encounter: Payer: Self-pay | Admitting: Internal Medicine

## 2020-01-27 ENCOUNTER — Ambulatory Visit: Payer: Self-pay | Attending: Internal Medicine | Admitting: Internal Medicine

## 2020-01-27 ENCOUNTER — Other Ambulatory Visit: Payer: Self-pay

## 2020-01-27 VITALS — BP 147/83 | HR 75 | Temp 96.3°F | Resp 16 | Wt 190.4 lb

## 2020-01-27 DIAGNOSIS — I10 Essential (primary) hypertension: Secondary | ICD-10-CM

## 2020-01-27 DIAGNOSIS — Z87891 Personal history of nicotine dependence: Secondary | ICD-10-CM | POA: Insufficient documentation

## 2020-01-27 DIAGNOSIS — E119 Type 2 diabetes mellitus without complications: Secondary | ICD-10-CM

## 2020-01-27 DIAGNOSIS — Z114 Encounter for screening for human immunodeficiency virus [HIV]: Secondary | ICD-10-CM

## 2020-01-27 DIAGNOSIS — Z7189 Other specified counseling: Secondary | ICD-10-CM

## 2020-01-27 DIAGNOSIS — Z1159 Encounter for screening for other viral diseases: Secondary | ICD-10-CM

## 2020-01-27 DIAGNOSIS — Z6835 Body mass index (BMI) 35.0-35.9, adult: Secondary | ICD-10-CM

## 2020-01-27 DIAGNOSIS — Z794 Long term (current) use of insulin: Secondary | ICD-10-CM

## 2020-01-27 DIAGNOSIS — Z23 Encounter for immunization: Secondary | ICD-10-CM

## 2020-01-27 LAB — POCT GLYCOSYLATED HEMOGLOBIN (HGB A1C): HbA1c, POC (controlled diabetic range): 9.7 % — AB (ref 0.0–7.0)

## 2020-01-27 LAB — GLUCOSE, POCT (MANUAL RESULT ENTRY): POC Glucose: 188 mg/dl — AB (ref 70–99)

## 2020-01-27 MED ORDER — LISINOPRIL 10 MG PO TABS: 10.0000 mg | ORAL_TABLET | Freq: Every day | ORAL | 3 refills | Status: DC

## 2020-01-27 MED ORDER — METFORMIN HCL 500 MG PO TABS: 500.0000 mg | ORAL_TABLET | Freq: Two times a day (BID) | ORAL | 3 refills | Status: DC

## 2020-01-27 MED ORDER — NOVOLOG FLEXPEN 100 UNIT/ML ~~LOC~~ SOPN: PEN_INJECTOR | SUBCUTANEOUS | 11 refills | Status: DC

## 2020-01-27 MED ORDER — PEN NEEDLES 31G X 8 MM MISC: 6 refills | Status: DC

## 2020-01-27 MED ORDER — LANTUS SOLOSTAR 100 UNIT/ML ~~LOC~~ SOPN: 35.0000 [IU] | PEN_INJECTOR | Freq: Every day | SUBCUTANEOUS | 99 refills | Status: DC

## 2020-01-27 MED FILL — !LANTUS SOLOSTAR 100UNITS/M: 100 | 25 days supply | Qty: 9 | Fill #0

## 2020-01-27 MED FILL — METFORMIN HCL 500 MG TABS: 500 | 30 days supply | Qty: 60 | Fill #0

## 2020-01-27 MED FILL — !HUMALOG 100 UNITS/ML KWIKP: 100 | 37 days supply | Qty: 6 | Fill #0

## 2020-01-27 MED FILL — TRUEPLUS PEN NDL 31GX5/16: 31G X 8 MM | 25 days supply | Qty: 100 | Fill #0

## 2020-01-27 MED FILL — LISINOPRIL 10 MG TABS: 10 | 30 days supply | Qty: 30 | Fill #0

## 2020-01-27 NOTE — Progress Notes (Signed)
Patient ID: Emily Clay, female    DOB: 05-06-1971  MRN: [id number]  CC: New Patient (Initial Visit) and Diabetes   Subjective: Emily Clay is a 49 y.o. female who presents for new patient visit  Her concerns today include:  Patient with history of former tob dep, diabetes type 2, HTN, obesity  Last PCP was Dr. Marshell Garfinkel who loss his license 3 yrs ago.     DIABETES TYPE 2 Last A1C:   BS  188/A1C 9.7 Med Adherence:  [x]  Yes - 20 units NPH BID, and Regular insulin 10 units BID.  She purchased these insulin over-the-counter.  She would like to try newer forms of insulin but states she is on what she can afford right now. Medication side effects:  []  Yes    [x]  No Home Monitoring?  [x]  Yes  -but not often Home glucose results range: Diet Adherence:"I very rarely eat sweets.  I eat vegetables every day.  I don't eat a lot of fried stuff."  Admits she eats late at nights.  Eats fruits but not daily Exercise: moves a lot at work.  Works in caff at [employer] Hypoglycemic episodes?: []  Yes    [x]  No Numbness of the feet? []  Yes    [x]  No Retinopathy hx? []  Yes    []  No Last eye exam: endorses blurred vision.  Over due for eye exam last was 5 yrs ago.  Use to wear glasses for distance.  No insurance.  Comments:  HYPERTENSION Currently taking: see medication list Med Adherence: []  Yes    [x]  No - out of Lisinopril x several mths Medication side effects: []  Yes    [x]  No Adherence with salt restriction: [x]  Yes    []  No Home Monitoring?: []  Yes    [x]  No Monitoring Frequency: []  Yes    []  No Home BP results range: []  Yes    []  No SOB? []  Yes    [x]  No Chest Pain?: []  Yes    [x]  No Leg swelling?: []  Yes    [x]  No Headaches?: []  Yes    [x]  No Dizziness? []  Yes    [x]  No Comments:   HM: over due for pap.  Did not get COVID vaccine as yet  Past medical, surgical, family history, social history reviewed and updated. Patient Active Problem List   Diagnosis Date Noted  . Former  smoker 01/27/2020  . Class 2 severe obesity due to excess calories with serious comorbidity and body mass index (BMI) of 35.0 to 35.9 in adult [facility]) 01/27/2020  . Essential hypertension 01/27/2020  . Diabetes (HCC) 07/25/2013  . Muscle strain 07/25/2013     Current Outpatient Medications on File Prior to Visit  Medication Sig Dispense Refill  . insulin regular (NOVOLIN R) 100 units/mL injection Inject 0.1 mLs (10 Units total) into the skin 2 (two) times daily. 10 mL 0   No current facility-administered medications on file prior to visit.    No Known Allergies  Social History   Socioeconomic History  . Marital status: Single    Spouse name: Not on file  . Number of children: 1  . Years of education: Not on file  . Highest education level: High school graduate  Occupational History  . Not on file  Tobacco Use  . Smoking status: Former Smoker    Types: Cigarettes  . Smokeless tobacco: Never Used  Vaping Use  . Vaping Use: Never used  Substance and Sexual Activity  .  Alcohol use: Yes    Alcohol/week: 4.0 standard drinks    Types: 4 Cans of beer per week  . Drug use: No  . Sexual activity: Yes  Other Topics Concern  . Not on file  Social History Narrative  . Not on file   Social Determinants of Health   Financial Resource Strain:   . Difficulty of Paying Living Expenses:   Food Insecurity:   . Worried About Programme researcher, broadcasting/film/video in the Last Year:   . Barista in the Last Year:   Transportation Needs:   . Freight forwarder (Medical):   Marland Kitchen Lack of Transportation (Non-Medical):   Physical Activity:   . Days of Exercise per Week:   . Minutes of Exercise per Session:   Stress:   . Feeling of Stress :   Social Connections:   . Frequency of Communication with Friends and Family:   . Frequency of Social Gatherings with Friends and Family:   . Attends Religious Services:   . Active Member of Clubs or Organizations:   . Attends Banker Meetings:     Marland Kitchen Marital Status:   Intimate Partner Violence:   . Fear of Current or Ex-Partner:   . Emotionally Abused:   Marland Kitchen Physically Abused:   . Sexually Abused:     Family History  Problem Relation Age of Onset  . Diabetes Mother   . Hypertension Mother   . Cancer Mother   . Diabetes Sister   . Hypertension Sister   . Hypertension Brother   . Diabetes Maternal Aunt     Past Surgical History:  Procedure Laterality Date  . CESAREAN SECTION      ROS: Review of Systems Negative except as stated above  PHYSICAL EXAM: BP (!) 147/83   Pulse 75   Temp (!) 96.3 F (35.7 C)   Resp 16   Wt 190 lb 6.4 oz (86.4 kg)   SpO2 97%   BMI 35.98 kg/m   Wt Readings from Last 3 Encounters:  01/27/20 190 lb 6.4 oz (86.4 kg)  12/18/19 175 lb (79.4 kg)  03/11/18 174 lb (78.9 kg)    Physical Exam  General appearance - alert, well appearing, obese African-American female and in no distress Mental status - normal mood, behavior, speech, dress, motor activity, and thought processes Eyes - pupils equal and reactive, extraocular eye movements intact Nose - normal and patent, no erythema, discharge or polyps Mouth - mucous membranes moist, pharynx normal without lesions Neck - supple, no significant adenopathy Chest - clear to auscultation, no wheezes, rales or rhonchi, symmetric air entry Heart - normal rate, regular rhythm, normal S1, S2, no murmurs, rubs, clicks or gallops Extremities - peripheral pulses normal, no pedal edema, no clubbing or cyanosis Diabetic Foot Exam - Simple   Simple Foot Form Visual Inspection See comments: Yes Sensation Testing Intact to touch and monofilament testing bilaterally: Yes Pulse Check Posterior Tibialis and Dorsalis pulse intact bilaterally: Yes Comments Small callus on the medial aspect of the right toe.    Depression screen [facility] 2/9 01/27/2020  Decreased Interest 0  Down, Depressed, Hopeless 0  PHQ - 2 Score 0     CMP Latest Ref Rng & Units  07/06/2019 03/11/2018 07/25/2013  Glucose 70 - 99 mg/dL 347(Q) 259(D) 638(V)  BUN 6 - 20 mg/dL 11 19 12   Creatinine 0.44 - 1.00 mg/dL 5.64 3.32  Sodium 135 - 145 mmol/L 134(L) 134(L) 136  Potassium 3.5 -  5.1 mmol/L 4.1 4.2 4.0  Chloride 98 - 111 mmol/L 100 98 104  CO2 22 - 32 mmol/L 25 - 25  Calcium 8.9 - 10.3 mg/dL 9.3 - 8.8  Total Protein 6.0 - 8.3 g/dL - - 7.0  Total Bilirubin 0.3 - 1.2 mg/dL - - 0.4  Alkaline Phos 39 - 117 U/L - - 50  AST 0 - 37 U/L - - 18  ALT 0 - 35 U/L - - 18   Lipid Panel     Component Value Date/Time   CHOL 164 07/25/2013 1246   TRIG 46 07/25/2013 1246   HDL 54 07/25/2013 1246   CHOLHDL 3.0 07/25/2013 1246   VLDL 9 07/25/2013 1246   LDLCALC 101 (H) 07/25/2013 1246    CBC    Component Value Date/Time   WBC 5.0 07/25/2013 1246   RBC 4.28 07/25/2013 1246   HGB 13.9 03/11/2018 1619   HCT 41.0 03/11/2018 1619   PLT 278 07/25/2013 1246   MCV 91.6 07/25/2013 1246   MCH 30.8 07/25/2013 1246   MCHC 33.7 07/25/2013 1246   RDW 13.8 07/25/2013 1246   LYMPHSABS 1.5 07/25/2013 1246   MONOABS 0.3 07/25/2013 1246   EOSABS 0.3 07/25/2013 1246   BASOSABS 0.0 07/25/2013 1246    ASSESSMENT AND PLAN: 1. Type 2 diabetes mellitus without complication, with long-term current use of insulin (HCC) Discussed the importance of healthy eating habits, regular aerobic exercise (at least 150 minutes a week as tolerated) and medication compliance to achieve or maintain control of diabetes. -I spent some time with dietary counseling.  Advised patient to eliminate sugary drinks from the diet, eat more white meat instead of red meat or pork, cut back on white carbohydrates, incorporate fresh fruits and vegetables into the diet. -We discussed putting her on newer insulin to include Lantus and NovoLog in the form of pens which I think she would qualify to get through our pharmacy.  Patient is agreeable to this.  We will put her on Lantus 35 units daily and NovoLog 8 units with  the 2 largest meals of the day.  However she will continue her current insulin of NPH and regular insulin until she gets seen with our clinical pharmacist next week to be taught how to use the pens. -Add Metformin 500 mg twice a day.  Patient informed of possible side effects especially bloating and diarrhea. -Advised to check blood sugars at least once to twice a day with goal of blood sugars before meals being between 90-130 - POCT glucose (manual entry) - POCT glycosylated hemoglobin (Hb A1C) - CBC - Comprehensive metabolic panel - Lipid panel - Microalbumin / creatinine urine ratio - metFORMIN (GLUCOPHAGE) 500 MG tablet; Take 1 tablet (500 mg total) by mouth 2 (two) times daily with a meal.  Dispense: 60 tablet; Refill: 3 - insulin glargine (LANTUS SOLOSTAR) 100 UNIT/ML Solostar Pen; Inject 35 Units into the skin daily.  Dispense: 5 pen; Refill: PRN - insulin aspart (NOVOLOG FLEXPEN) 100 UNIT/ML FlexPen; 8 units subcut with the two largest meals of the day  Dispense: 15 mL; Refill: 11 - Insulin Pen Needle (PEN NEEDLES) 31G X 8 MM MISC; UAD  Dispense: 100 each; Refill: 6  2. Essential hypertension Not at goal.  Restart lisinopril.  DASH diet discussed and encouraged - lisinopril (ZESTRIL) 10 MG tablet; Take 1 tablet (10 mg total) by mouth daily.  Dispense: 30 tablet; Refill: 3  3. Class 2 severe obesity due to excess calories with serious comorbidity  and body mass index (BMI) of 35.0 to 35.9 in adult [facility](HCC) See #1 above.  4.  Former smoker  Encourage her to remain tobacco free  5. Screening for HIV (human immunodeficiency virus) Patient agreeable to screening - HIV Antibody (routine testing w rflx)  6. Need for hepatitis C screening test Patient agreeable to screening - Hepatitis C Antibody  7. Educated about COVID-19 virus infection Discussed the COVID-19 vaccines.  Patient informed that vaccines are safe and effective.  Have encouraged her to get the vaccine  8. Need for  vaccination against Streptococcus pneumoniae Patient needs to complete form to allow her to get this vaccine from us.  We will plan to give it on next visit.  Advised patient to apply for the orange card/cone discount.  Patient was given the opportunity to ask questions.  Patient verbalized understanding of the plan and was able to repeat key elements of the plan.   Orders Placed This Encounter  Procedures  . CBC  . Comprehensive metabolic panel  . Lipid panel  . Microalbumin / creatinine urine ratio  . HIV Antibody (routine testing w rflx)  . Hepatitis C Antibody  . POCT glucose (manual entry)  . POCT glycosylated hemoglobin (Hb A1C)     Requested Prescriptions   Signed Prescriptions Disp Refills  . metFORMIN (GLUCOPHAGE) 500 MG tablet 60 tablet 3    Sig: Take 1 tablet (500 mg total) by mouth 2 (two) times daily with a meal.  . insulin glargine (LANTUS SOLOSTAR) 100 UNIT/ML Solostar Pen 5 pen PRN    Sig: Inject 35 Units into the skin daily.  . insulin aspart (NOVOLOG FLEXPEN) 100 UNIT/ML FlexPen 15 mL 11    Sig: 8 units subcut with the two largest meals of the day  . lisinopril (ZESTRIL) 10 MG tablet 30 tablet 3    Sig: Take 1 tablet (10 mg total) by mouth daily.  . Insulin Pen Needle (PEN NEEDLES) 31G X 8 MM MISC 100 each 6    Sig: UAD    Return in about 2 months (around 03/29/2020), or PAP, for [facility] next week for teaching on insulin pens.  Jonah Blueeborah Ketzia Guzek, MD, FACP

## 2020-01-27 NOTE — Patient Instructions (Signed)
Obesity, Adult Obesity is having too much body fat. Being obese means that your weight is more than what is healthy for you. BMI is a number that explains how much body fat you have. If you have a BMI of 30 or more, you are obese. Obesity is often caused by eating or drinking more calories than your body uses. Changing your lifestyle can help you lose weight. Obesity can cause serious health problems, such as:  Stroke.  Coronary artery disease (CAD).  Type 2 diabetes.  Some types of cancer, including cancers of the colon, breast, uterus, and gallbladder.  Osteoarthritis.  High blood pressure (hypertension).  High cholesterol.  Sleep apnea.  Gallbladder stones.  Infertility problems. What are the causes?  Eating meals each day that are high in calories, sugar, and fat.  Being born with genes that may make you more likely to become obese.  Having a medical condition that causes obesity.  Taking certain medicines.  Sitting a lot (having a sedentary lifestyle).  Not getting enough sleep.  Drinking a lot of drinks that have sugar in them. What increases the risk?  Having a family history of obesity.  Being an African American woman.  Being a Hispanic man.  Living in an area with limited access to: ? Parks, recreation centers, or sidewalks. ? Healthy food choices, such as grocery stores and farmers' markets. What are the signs or symptoms? The main sign is having too much body fat. How is this treated?  Treatment for this condition often includes changing your lifestyle. Treatment may include: ? Changing your diet. This may include making a healthy meal plan. ? Exercise. This may include activity that causes your heart to beat faster (aerobic exercise) and strength training. Work with your doctor to design a program that works for you. ? Medicine to help you lose weight. This may be used if you are not able to lose 1 pound a week after 6 weeks of healthy eating and  more exercise. ? Treating conditions that cause the obesity. ? Surgery. Options may include gastric banding and gastric bypass. This may be done if:  Other treatments have not helped to improve your condition.  You have a BMI of 40 or higher.  You have life-threatening health problems related to obesity. Follow these instructions at home: Eating and drinking   Follow advice from your doctor about what to eat and drink. Your doctor may tell you to: ? Limit fast food, sweets, and processed snack foods. ? Choose low-fat options. For example, choose low-fat milk instead of whole milk. ? Eat 5 or more servings of fruits or vegetables each day. ? Eat at home more often. This gives you more control over what you eat. ? Choose healthy foods when you eat out. ? Learn to read food labels. This will help you learn how much food is in 1 serving. ? Keep low-fat snacks available. ? Avoid drinks that have a lot of sugar in them. These include soda, fruit juice, iced tea with sugar, and flavored milk.  Drink enough water to keep your pee (urine) pale yellow.  Do not go on fad diets. Physical activity  Exercise often, as told by your doctor. Most adults should get up to 150 minutes of moderate-intensity exercise every week.Ask your doctor: ? What types of exercise are safe for you. ? How often you should exercise.  Warm up and stretch before being active.  Do slow stretching after being active (cool down).  Rest between   times of being active. Lifestyle  Work with your doctor and a food expert (dietitian) to set a weight-loss goal that is best for you.  Limit your screen time.  Find ways to reward yourself that do not involve food.  Do not drink alcohol if: ? Your doctor tells you not to drink. ? You are pregnant, may be pregnant, or are planning to become pregnant.  If you drink alcohol: ? Limit how much you use to:  0-1 drink a day for women.  0-2 drinks a day for men. ? Be  aware of how much alcohol is in your drink. In the [place] one drink equals one 12 oz bottle of beer (355 mL), one 5 oz glass of wine (148 mL), or one 1 oz glass of hard liquor (44 mL). General instructions  Keep a weight-loss journal. This can help you keep track of: ? The food that you eat. ? How much exercise you get.  Take over-the-counter and prescription medicines only as told by your doctor.  Take vitamins and supplements only as told by your doctor.  Think about joining a support group.  Keep all follow-up visits as told by your doctor. This is important. Contact a doctor if:  You cannot meet your weight loss goal after you have changed your diet and lifestyle for 6 weeks. Get help right away if you:  Are having trouble breathing.  Are having thoughts of harming yourself. Summary  Obesity is having too much body fat.  Being obese means that your weight is more than what is healthy for you.  Work with your doctor to set a weight-loss goal.  Get regular exercise as told by your doctor. This information is not intended to replace advice given to you by your health care provider. Make sure you discuss any questions you have with your health care provider. Document Revised: 03/18/2018 Document Reviewed: 03/18/2018 Elsevier Patient Education  2020 Elsevier Inc.   Diabetes Mellitus and Standards of Medical Care Managing diabetes (diabetes mellitus) can be complicated. Your diabetes treatment may be managed by a team of health care providers, including:  A physician who specializes in diabetes (endocrinologist).  A nurse practitioner or physician assistant.  Nurses.  A diet and nutrition specialist (registered dietitian).  A certified diabetes educator (CDE).  An exercise specialist.  A pharmacist.  An eye doctor.  A foot specialist (podiatrist).  A dentist.  A primary care provider.  A mental health provider. Your health care providers follow guidelines  to help you get the best quality of care. The following schedule is a general guideline for your diabetes management plan. Your health care providers may give you more specific instructions. Physical exams Upon being diagnosed with diabetes mellitus, and each year after that, your health care provider will ask about your medical and family history. He or she will also do a physical exam. Your exam may include:  Measuring your height, weight, and body mass index (BMI).  Checking your blood pressure. This will be done at every routine medical visit. Your target blood pressure may vary depending on your medical conditions, your age, and other factors.  Thyroid gland exam.  Skin exam.  Screening for damage to your nerves (peripheral neuropathy). This may include checking the pulse in your legs and feet and checking the level of sensation in your hands and feet.  A complete foot exam to inspect the structure and skin of your feet, including checking for cuts, bruises, redness, blisters, sores, or  other problems.  Screening for blood vessel (vascular) problems, which may include checking the pulse in your legs and feet and checking your temperature. Blood tests Depending on your treatment plan and your personal needs, you may have the following tests done:  HbA1c (hemoglobin A1c). This test provides information about blood sugar (glucose) control over the previous 2-3 months. It is used to adjust your treatment plan, if needed. This test will be done: ? At least 2 times a year, if you are meeting your treatment goals. ? 4 times a year, if you are not meeting your treatment goals or if treatment goals have changed.  Lipid testing, including total, LDL, and HDL cholesterol and triglyceride levels. ? The goal for LDL is less than 100 mg/dL (5.5 mmol/L). If you are at high risk for complications, the goal is less than 70 mg/dL (3.9 mmol/L). ? The goal for HDL is 40 mg/dL (2.2 mmol/L) or higher for  men and 50 mg/dL (2.8 mmol/L) or higher for women. An HDL cholesterol of 60 mg/dL (3.3 mmol/L) or higher gives some protection against heart disease. ? The goal for triglycerides is less than 150 mg/dL (8.3 mmol/L).  Liver function tests.  Kidney function tests.  Thyroid function tests. Dental and eye exams  Visit your dentist two times a year.  If you have type 1 diabetes, your health care provider may recommend an eye exam 3-5 years after you are diagnosed, and then once a year after your first exam. ? For children with type 1 diabetes, a health care provider may recommend an eye exam when your child is age 90 or older and has had diabetes for 3-5 years. After the first exam, your child should get an eye exam once a year.  If you have type 2 diabetes, your health care provider may recommend an eye exam as soon as you are diagnosed, and then once a year after your first exam. Immunizations   The yearly flu (influenza) vaccine is recommended for everyone 6 months or older who has diabetes.  The pneumonia (pneumococcal) vaccine is recommended for everyone 2 years or older who has diabetes. If you are 13 or older, you may get the pneumonia vaccine as a series of two separate shots.  The hepatitis B vaccine is recommended for adults shortly after being diagnosed with diabetes.  Adults and children with diabetes should receive all other vaccines according to age-specific recommendations from the Centers for Disease Control and Prevention (CDC). Mental and emotional health Screening for symptoms of eating disorders, anxiety, and depression is recommended at the time of diagnosis and afterward as needed. If your screening shows that you have symptoms (positive screening result), you may need more evaluation and you may work with a mental health care provider. Treatment plan Your treatment plan will be reviewed at every medical visit. You and your health care provider will discuss:  How you  are taking your medicines, including insulin.  Any side effects you are experiencing.  Your blood glucose target goals.  The frequency of your blood glucose monitoring.  Lifestyle habits, such as activity level as well as tobacco, alcohol, and substance use. Diabetes self-management education Your health care provider will assess how well you are monitoring your blood glucose levels and whether you are taking your insulin correctly. He or she may refer you to:  A certified diabetes educator to manage your diabetes throughout your life, starting at diagnosis.  A registered dietitian who can create or review your personal  nutrition plan.  An exercise specialist who can discuss your activity level and exercise plan. Summary  Managing diabetes (diabetes mellitus) can be complicated. Your diabetes treatment may be managed by a team of health care providers.  Your health care providers follow guidelines in order to help you get the best quality of care.  Standards of care including having regular physical exams, blood tests, blood pressure monitoring, immunizations, screening tests, and education about how to manage your diabetes.  Your health care providers may also give you more specific instructions based on your individual health. This information is not intended to replace advice given to you by your health care provider. Make sure you discuss any questions you have with your health care provider. Document Revised: 04/02/2018 Document Reviewed: 04/11/2016 Elsevier Patient Education  2020 [employer].

## 2020-01-27 NOTE — Progress Notes (Signed)
cbg- [id number] a1c- 9.7

## 2020-01-28 ENCOUNTER — Other Ambulatory Visit: Payer: Self-pay | Admitting: Internal Medicine

## 2020-01-28 ENCOUNTER — Encounter: Payer: Self-pay | Admitting: Internal Medicine

## 2020-01-28 DIAGNOSIS — E1169 Type 2 diabetes mellitus with other specified complication: Secondary | ICD-10-CM | POA: Insufficient documentation

## 2020-01-28 LAB — COMPREHENSIVE METABOLIC PANEL
ALT: 21 IU/L (ref 0–32)
AST: 21 IU/L (ref 0–40)
Albumin/Globulin Ratio: 1.4 (ref 1.2–2.2)
Albumin: 4.2 g/dL (ref 3.8–4.8)
Alkaline Phosphatase: 71 IU/L (ref 48–121)
BUN/Creatinine Ratio: 20 (ref 9–23)
BUN: 17 mg/dL (ref 6–24)
Bilirubin Total: 0.2 mg/dL (ref 0.0–1.2)
CO2: 23 mmol/L (ref 20–29)
Calcium: 9.6 mg/dL (ref 8.7–10.2)
Chloride: 101 mmol/L (ref 96–106)
Creatinine, Ser: 0.87 mg/dL (ref 0.57–1.00)
GFR calc Af Amer: 92 mL/min/{1.73_m2} (ref >59)
GFR calc non Af Amer: 80 mL/min/{1.73_m2} (ref >59)
Globulin, Total: 2.9 g/dL (ref 1.5–4.5)
Glucose: 211 mg/dL — ABNORMAL HIGH (ref 65–99)
Potassium: 4.4 mmol/L (ref 3.5–5.2)
Sodium: 137 mmol/L (ref 134–144)
Total Protein: 7.1 g/dL (ref 6.0–8.5)

## 2020-01-28 LAB — CBC
Hematocrit: 41 % (ref 34.0–46.6)
Hemoglobin: 13.7 g/dL (ref 11.1–15.9)
MCH: 32 pg (ref 26.6–33.0)
MCHC: 33.4 g/dL (ref 31.5–35.7)
MCV: 96 fL (ref 79–97)
Platelets: 259 10*3/uL (ref 150–450)
RBC: 4.28 x10E6/uL (ref 3.77–5.28)
RDW: 12.8 % (ref 11.7–15.4)
WBC: 7.4 10*3/uL (ref 3.4–10.8)

## 2020-01-28 LAB — LIPID PANEL
Chol/HDL Ratio: 2.9 ratio (ref 0.0–4.4)
Cholesterol, Total: 194 mg/dL (ref 100–199)
HDL: 68 mg/dL (ref >39)
LDL Chol Calc (NIH): 109 mg/dL — ABNORMAL HIGH (ref 0–99)
Triglycerides: 97 mg/dL (ref 0–149)
VLDL Cholesterol Cal: 17 mg/dL (ref 5–40)

## 2020-01-28 LAB — MICROALBUMIN / CREATININE URINE RATIO
Creatinine, Urine: 118 mg/dL
Microalb/Creat Ratio: 440 mg/g creat — ABNORMAL HIGH (ref 0–29)
Microalbumin, Urine: 518.8 ug/mL

## 2020-01-28 LAB — HIV ANTIBODY (ROUTINE TESTING W REFLEX): HIV Screen 4th Generation wRfx: NONREACTIVE

## 2020-01-28 LAB — HEPATITIS C ANTIBODY: Hep C Virus Ab: <0.1 s/co ratio (ref 0.0–0.9)

## 2020-01-28 MED ORDER — ATORVASTATIN CALCIUM 10 MG PO TABS: 10.0000 mg | ORAL_TABLET | Freq: Every day | ORAL | 3 refills | Status: DC

## 2020-01-28 NOTE — Progress Notes (Signed)
Let patient know:Blood count is normal meaning no anemia.Screening test for HIV and hepatitis C was negative.Kidney and liver function tests normal.LDL cholesterol is 109 with goal being less than 70 in patients with diabetes.  High cholesterol can increase risk for heart attack and strokes.  I recommend starting a medication called atorvastatin to help lower cholesterol.  The prescription has been sent to our pharmacy for her to pick up.

## 2020-01-28 NOTE — Progress Notes (Signed)
Let patient know that she has increased protein in the urine.  This can be due to diabetes affecting the kidney.  The blood pressure medication which we started her on hold lisinopril will help to correct this.

## 2020-01-31 MED FILL — ATORVASTATIN 10 MG TABLET: 10 | 30 days supply | Qty: 30 | Fill #0

## 2020-02-01 ENCOUNTER — Telehealth: Payer: Self-pay

## 2020-02-01 NOTE — Telephone Encounter (Signed)
Contacted pt to go over lab results pt is aware and doesn't have any questions or concerns 

## 2020-02-16 ENCOUNTER — Ambulatory Visit: Payer: Medicaid Other | Admitting: Pharmacist

## 2020-02-17 ENCOUNTER — Telehealth: Payer: Self-pay | Admitting: General Practice

## 2020-02-17 MED FILL — ?BASAGLAR 100 UNITS/ML KWPE: 100 | 25 days supply | Qty: 9 | Fill #1

## 2020-02-17 NOTE — Telephone Encounter (Signed)
Pt called to place a refill. I have done this for her.

## 2020-02-17 NOTE — Telephone Encounter (Signed)
Please advise.   Copied from CRM [id number]. Topic: Quick Communication - Rx Refill/Question >> Feb 17, 2020  1:31 PM Randol Kern wrote: Pt called and needs a call back from clinic regarding her insulin glargine (LANTUS SOLOSTAR) 100 UNIT/ML Solostar Pen - has questions Best contact [phone]

## 2020-02-17 NOTE — Telephone Encounter (Signed)
May you please follow up with pt

## 2020-02-20 ENCOUNTER — Other Ambulatory Visit: Payer: Self-pay

## 2020-02-20 ENCOUNTER — Ambulatory Visit: Payer: Self-pay | Attending: Family Medicine | Admitting: Pharmacist

## 2020-02-20 DIAGNOSIS — Z23 Encounter for immunization: Secondary | ICD-10-CM

## 2020-02-20 NOTE — Progress Notes (Signed)
Patient was educated on the use of the Lantus and Novolog pens. Reviewed necessary supplies and operation of the pens. Also reviewed goal blood glucose levels. Patient was able to demonstrate use. All questions and concerns were addressed.  Patient presents for vaccination against strep pneumo per orders of Dr. Laural Benes. Consent given. Counseling provided. No contraindications exists. Vaccine administered without incident. She has completed patient assistance paperwork.

## 2020-02-28 ENCOUNTER — Telehealth: Payer: Self-pay | Admitting: Internal Medicine

## 2020-02-28 DIAGNOSIS — E119 Type 2 diabetes mellitus without complications: Secondary | ICD-10-CM

## 2020-02-28 DIAGNOSIS — Z794 Long term (current) use of insulin: Secondary | ICD-10-CM

## 2020-02-28 NOTE — Telephone Encounter (Signed)
atorvastatin (LIPITOR) 10 MG tablet Medication Date: 01/28/2020 Department: [facility] Ordering/Authorizing: Marcine Matar, MD   insulin aspart (NOVOLOG FLEXPEN) 100 UNIT/ML FlexPen Medication Date: 01/27/2020 Department: [facility] Ordering/Authorizing: Marcine Matar, MD   insulin glargine (LANTUS SOLOSTAR) 100 UNIT/ML Solostar Pen Medication Date: 01/27/2020 Department: [facility] Ordering/Authorizing: Marcine Matar, MD   Insulin Pen Needle (PEN NEEDLES) 31G X 8 MM MISC if this is what screws into pump??? Medication Date: 01/27/2020 Department: [facility] Ordering/Authorizing: Marcine Matar, MD  Order Providers metFORMIN (GLUCOPHAGE) 500 MG tablet Medication Date: 01/27/2020 Department: [facility] Ordering/Authorizing: Marcine Matar, MD  Order Providers

## 2020-02-28 NOTE — Telephone Encounter (Signed)
Patient called, left VM to contact the pharmacy to request the refills, there are refills available on the requested medications.

## 2020-03-01 MED FILL — TRUEPLUS PEN NDL 31GX5/16: 31G X 8 MM | 25 days supply | Qty: 100 | Fill #1

## 2020-03-01 MED FILL — METFORMIN HCL 500 MG TABS: 500 | 30 days supply | Qty: 60 | Fill #1

## 2020-03-01 MED FILL — ?ATORVASTATIN 10 MG TABLET: 10 | 30 days supply | Qty: 30 | Fill #1

## 2020-03-02 MED FILL — ?HUMALOG 100 UNITS/ML KWIKP: 100 | 37 days supply | Qty: 6 | Fill #1

## 2020-03-19 MED FILL — ?BASAGLAR 100 UNITS/ML KWPE: 100 | 25 days supply | Qty: 9 | Fill #2

## 2020-04-05 MED FILL — TRUEPLUS PEN NDL 31GX5/16: 31G X 8 MM | 25 days supply | Qty: 100 | Fill #2

## 2020-04-05 MED FILL — LISINOPRIL 10 MG TABS: 10 | 30 days supply | Qty: 30 | Fill #1

## 2020-04-05 MED FILL — ?ATORVASTATIN 10 MG TABLET: 10 | 30 days supply | Qty: 30 | Fill #2

## 2020-04-06 MED FILL — METFORMIN HCL 500 MG TABS: 500 | 30 days supply | Qty: 60 | Fill #2

## 2020-04-12 MED FILL — ?HUMALOG 100 UNITS/ML KWIKP: 100 | 18 days supply | Qty: 3 | Fill #2

## 2020-04-13 MED FILL — ?BASAGLAR 100 UNITS/ML KWPE: 100 | 8 days supply | Qty: 3 | Fill #3

## 2020-05-04 MED FILL — METFORMIN HCL 500 MG TABS: 500 | 30 days supply | Qty: 60 | Fill #3

## 2020-05-04 MED FILL — ?ATORVASTATIN 10 MG TABLET: 10 | 30 days supply | Qty: 30 | Fill #3

## 2020-05-04 MED FILL — TRUEPLUS PEN NDL 31GX5/16: 31G X 8 MM | 25 days supply | Qty: 100 | Fill #3

## 2020-05-04 MED FILL — LISINOPRIL 10 MG TABS: 10 | 30 days supply | Qty: 30 | Fill #2

## 2020-06-08 ENCOUNTER — Other Ambulatory Visit: Payer: Self-pay | Admitting: Internal Medicine

## 2020-06-08 DIAGNOSIS — Z794 Long term (current) use of insulin: Secondary | ICD-10-CM

## 2020-06-08 MED FILL — TRUEPLUS PEN NDL 31GX5/16: 31G X 8 MM | 25 days supply | Qty: 100 | Fill #4

## 2020-06-08 MED FILL — ?ATORVASTATIN 10 MG TABLET: 10 | 30 days supply | Qty: 30 | Fill #4

## 2020-06-08 MED FILL — LISINOPRIL 10 MG TABS: 10 | 30 days supply | Qty: 30 | Fill #3

## 2020-06-08 MED FILL — METFORMIN HCL 500 MG TABS: 500 | 30 days supply | Qty: 60 | Fill #0

## 2020-07-10 ENCOUNTER — Other Ambulatory Visit: Payer: Self-pay | Admitting: Internal Medicine

## 2020-07-10 DIAGNOSIS — I10 Essential (primary) hypertension: Secondary | ICD-10-CM

## 2020-07-10 MED FILL — ?ATORVASTATIN 10 MG TABLET: 10 | 30 days supply | Qty: 30 | Fill #5

## 2020-07-10 MED FILL — LISINOPRIL 10 MG TABS: 10 | 30 days supply | Qty: 30 | Fill #0

## 2020-07-10 MED FILL — METFORMIN HCL 500 MG TABS: 500 | 30 days supply | Qty: 60 | Fill #1

## 2020-07-10 MED FILL — TRUEPLUS PEN NDL 31GX5/16: 31G X 8 MM | 25 days supply | Qty: 100 | Fill #5

## 2020-08-16 ENCOUNTER — Other Ambulatory Visit: Payer: Self-pay | Admitting: Internal Medicine

## 2020-08-16 DIAGNOSIS — Z794 Long term (current) use of insulin: Secondary | ICD-10-CM

## 2020-08-16 MED FILL — LISINOPRIL 10 MG TABS: 10 | 30 days supply | Qty: 30 | Fill #1

## 2020-08-16 MED FILL — ?ATORVASTATIN 10 MG TABLET: 10 | 30 days supply | Qty: 30 | Fill #6

## 2020-08-16 MED FILL — TRUEPLUS PEN NDL 31GX5/16: 31G X 8 MM | 25 days supply | Qty: 100 | Fill #6

## 2020-08-17 MED FILL — METFORMIN HCL 500 MG TABS: 500 | 30 days supply | Qty: 60 | Fill #0

## 2020-08-24 ENCOUNTER — Ambulatory Visit: Payer: Medicaid Other | Attending: Family

## 2020-08-24 DIAGNOSIS — Z23 Encounter for immunization: Secondary | ICD-10-CM

## 2020-09-14 ENCOUNTER — Other Ambulatory Visit: Payer: Self-pay | Admitting: Internal Medicine

## 2020-09-14 DIAGNOSIS — Z794 Long term (current) use of insulin: Secondary | ICD-10-CM

## 2020-09-14 DIAGNOSIS — E119 Type 2 diabetes mellitus without complications: Secondary | ICD-10-CM

## 2020-09-14 MED FILL — TRUEPLUS 5-BEVEL PEN NEEDLE: 31G X 5 MM | 25 days supply | Qty: 100 | Fill #0

## 2020-09-14 MED FILL — ?ATORVASTATIN 10 MG TABLET: 10 | 30 days supply | Qty: 30 | Fill #7

## 2020-09-14 MED FILL — LISINOPRIL 10 MG TABS: 10 | 30 days supply | Qty: 30 | Fill #2

## 2020-10-10 ENCOUNTER — Ambulatory Visit: Payer: Self-pay | Admitting: Physician Assistant

## 2020-10-10 ENCOUNTER — Other Ambulatory Visit: Payer: Self-pay

## 2020-10-15 ENCOUNTER — Ambulatory Visit: Payer: Self-pay | Attending: Physician Assistant | Admitting: Family Medicine

## 2020-10-15 ENCOUNTER — Other Ambulatory Visit: Payer: Self-pay

## 2020-10-15 ENCOUNTER — Other Ambulatory Visit: Payer: Self-pay | Admitting: Family Medicine

## 2020-10-15 DIAGNOSIS — I152 Hypertension secondary to endocrine disorders: Secondary | ICD-10-CM

## 2020-10-15 DIAGNOSIS — B373 Candidiasis of vulva and vagina: Secondary | ICD-10-CM

## 2020-10-15 DIAGNOSIS — Z794 Long term (current) use of insulin: Secondary | ICD-10-CM

## 2020-10-15 DIAGNOSIS — B3731 Acute candidiasis of vulva and vagina: Secondary | ICD-10-CM

## 2020-10-15 DIAGNOSIS — E1159 Type 2 diabetes mellitus with other circulatory complications: Secondary | ICD-10-CM

## 2020-10-15 DIAGNOSIS — E119 Type 2 diabetes mellitus without complications: Secondary | ICD-10-CM

## 2020-10-15 MED ORDER — FLUCONAZOLE 150 MG PO TABS: 150.0000 mg | ORAL_TABLET | Freq: Once | ORAL | 0 refills | Status: DC

## 2020-10-15 MED ORDER — LISINOPRIL 10 MG PO TABS: 10.0000 mg | ORAL_TABLET | Freq: Every day | ORAL | 3 refills | Status: DC

## 2020-10-15 MED FILL — LISINOPRIL 10 MG TABS: 10 | 30 days supply | Qty: 30 | Fill #0

## 2020-10-15 MED FILL — FLUCONAZOLE 150 MG TABLET: 150 | 1 days supply | Qty: 1 | Fill #0

## 2020-10-15 NOTE — Progress Notes (Signed)
Virtual Visit via Video Note  I connected with Emily Clay, on 10/15/2020 at 10:08 AM by video enabled telemedicine device due to the COVID-19 pandemic and verified that I am speaking with the correct person using two identifiers.   Consent: I discussed the limitations, risks, security and privacy concerns of performing an evaluation and management service by telemedicine and the availability of in person appointments. I also discussed with the patient that there may be a patient responsible charge related to this service. The patient expressed understanding and agreed to proceed.   Location of Patient: Work  Biomedical scientist of Provider: Clinic   Persons participating in Telemedicine visit: [name] Dr. Margarita Rana     History of Present Illness: 50 year old female with history of type 2 diabetes mellitus (A1c 9.7 from 01/2020), hypertension, hyperlipidemia.  Obesity who is seen for chronic disease management.  She is requesting refill of Metformin which she has been out of for the last 3 weeks and was informed she needed an office visit with her PCP prior to receiving refills. She has been on Basaglar 35 units nightly and NovoLog 8 units twice daily. Denies presence of neuropathy, hypoglycemia, visual concerns. Doing well on her antihypertensive and statin.   Complains of intermittent vaginal itching and thinks she might have a yeast infection.  She has no urinary symptoms. Denies presence of chest pain, dyspnea, paroxysmal nocturnal dyspnea, pedal edema. Past Medical History:  Diagnosis Date  . Diabetes mellitus   . Hypertension   . Proteinuria    No Known Allergies  Current Outpatient Medications on File Prior to Visit  Medication Sig Dispense Refill  . atorvastatin (LIPITOR) 10 MG tablet Take 1 tablet (10 mg total) by mouth daily. 90 tablet 3  . insulin aspart (NOVOLOG FLEXPEN) 100 UNIT/ML FlexPen 8 units subcut with the two largest meals of the day 15 mL 11  . insulin  glargine (LANTUS SOLOSTAR) 100 UNIT/ML Solostar Pen Inject 35 Units into the skin daily. 5 pen PRN  . insulin regular (NOVOLIN R) 100 units/mL injection Inject 0.1 mLs (10 Units total) into the skin 2 (two) times daily. 10 mL 0  . lisinopril (ZESTRIL) 10 MG tablet TAKE 1 TABLET (10 MG TOTAL) BY MOUTH DAILY. 30 tablet 3  . metFORMIN (GLUCOPHAGE) 500 MG tablet TAKE 1 TABLET (500 MG TOTAL) BY MOUTH 2 (TWO) TIMES DAILY WITH A MEAL. 60 tablet 0  . TRUEPLUS PEN NEEDLES 31G X 8 MM MISC USE AS DIRECTED 100 each 6   No current facility-administered medications on file prior to visit.    ROS: See HPI  Observations/Objective: Awake, alert, oriented x3, obese Not in respiratory distress Speaking in full sentences Normal mood  CMP Latest Ref Rng & Units 01/27/2020 07/06/2019 03/11/2018  Glucose 65 - 99 mg/dL 211(H) 188(H) 384(H)  BUN 6 - 24 mg/dL 17 11 19   Creatinine 0.57 - 1.00 mg/dL 0.87 0.79 0.90  Sodium 134 - 144 mmol/L 137 134(L) 134(L)  Potassium 3.5 - 5.2 mmol/L 4.4 4.1 4.2  Chloride 96 - 106 mmol/L 101 100 98  CO2 20 - 29 mmol/L 23 25 -  Calcium 8.7 - 10.2 mg/dL 9.6 9.3 -  Total Protein 6.0 - 8.5 g/dL 7.1 - -  Total Bilirubin 0.0 - 1.2 mg/dL 0.2 - -  Alkaline Phos 48 - 121 IU/L 71 - -  AST 0 - 40 IU/L 21 - -  ALT 0 - 32 IU/L 21 - -    Lipid Panel  Component Value Date/Time   CHOL 194 01/27/2020 1510   TRIG 97 01/27/2020 1510   HDL 68 01/27/2020 1510   CHOLHDL 2.9 01/27/2020 1510   CHOLHDL 3.0 07/25/2013 1246   VLDL 9 07/25/2013 1246   LDLCALC 109 (H) 01/27/2020 1510   LABVLDL 17 01/27/2020 1510    Lab Results  Component Value Date   HGBA1C 9.7 (A) 01/27/2020      Assessment and Plan: 1. Hypertension associated with diabetes ([place]) Blood pressure was slightly elevated at last office visit at 147/83 This will need to be reassessed at an in person visit with PCP Continue lisinopril Counseled on blood pressure goal of less than 130/80, low-sodium, DASH diet, medication  compliance, 150 minutes of moderate intensity exercise per week. Discussed medication compliance, adverse effects. - lisinopril (ZESTRIL) 10 MG tablet; Take 1 tablet (10 mg total) by mouth daily.  Dispense: 30 tablet; Refill: 3  2. Type 2 diabetes mellitus without complication, with long-term current use of insulin (HCC) Uncontrolled with A1c of 9.7 We will order A1c and adjust regimen accordingly - CMP14+EGFR; Future - Hemoglobin A1c; Future  3. Vaginal candidiasis Likely secondary to hyperglycemia Hopefully improvement in glycemic control should bring about relief of symptoms - fluconazole (DIFLUCAN) 150 MG tablet; Take 1 tablet (150 mg total) by mouth once for 1 dose.  Dispense: 1 tablet; Refill: 0   Follow Up Instructions: 3 months with PCP   I discussed the assessment and treatment plan with the patient. The patient was provided an opportunity to ask questions and all were answered. The patient agreed with the plan and demonstrated an understanding of the instructions.   The patient was advised to call back or seek an in-person evaluation if the symptoms worsen or if the condition fails to improve as anticipated.     I provided 18 minutes total of Telehealth time during this encounter including median intraservice time, reviewing previous notes, investigations, ordering medications, medical decision making, coordinating care and patient verbalized understanding at the end of the visit.     Charlott Rakes, MD, FAAFP. [facility] and [place], [place]   10/15/2020, 10:08 AM

## 2020-10-16 ENCOUNTER — Other Ambulatory Visit: Payer: Self-pay

## 2020-10-16 ENCOUNTER — Ambulatory Visit: Payer: Self-pay | Attending: Internal Medicine

## 2020-10-16 DIAGNOSIS — E119 Type 2 diabetes mellitus without complications: Secondary | ICD-10-CM

## 2020-10-16 DIAGNOSIS — Z794 Long term (current) use of insulin: Secondary | ICD-10-CM

## 2020-10-17 ENCOUNTER — Other Ambulatory Visit: Payer: Self-pay | Admitting: Pharmacist

## 2020-10-17 DIAGNOSIS — Z794 Long term (current) use of insulin: Secondary | ICD-10-CM

## 2020-10-17 DIAGNOSIS — E119 Type 2 diabetes mellitus without complications: Secondary | ICD-10-CM

## 2020-10-17 MED ORDER — METFORMIN HCL 500 MG PO TABS: 500.0000 mg | ORAL_TABLET | Freq: Two times a day (BID) | ORAL | 2 refills | Status: DC

## 2020-10-17 MED FILL — ?METFORMIN HCL 500MG TABLET: 500 | 30 days supply | Qty: 60 | Fill #0

## 2020-10-17 MED FILL — TRUEPLUS 5-BEVEL PEN NEEDLE: 31G X 5 MM | 25 days supply | Qty: 100 | Fill #1

## 2020-10-17 MED FILL — ?ATORVASTATIN 10 MG TABLET: 10 | 30 days supply | Qty: 30 | Fill #8

## 2020-10-18 ENCOUNTER — Other Ambulatory Visit: Payer: Self-pay | Admitting: Family Medicine

## 2020-10-18 DIAGNOSIS — Z794 Long term (current) use of insulin: Secondary | ICD-10-CM

## 2020-10-18 DIAGNOSIS — E119 Type 2 diabetes mellitus without complications: Secondary | ICD-10-CM

## 2020-10-18 LAB — CMP14+EGFR
ALT: 27 IU/L (ref 0–32)
AST: 27 IU/L (ref 0–40)
Albumin/Globulin Ratio: 1.2 (ref 1.2–2.2)
Albumin: 3.9 g/dL (ref 3.8–4.8)
Alkaline Phosphatase: 90 IU/L (ref 44–121)
BUN/Creatinine Ratio: 17 (ref 9–23)
BUN: 15 mg/dL (ref 6–24)
Bilirubin Total: 0.3 mg/dL (ref 0.0–1.2)
CO2: 21 mmol/L (ref 20–29)
Calcium: 9.5 mg/dL (ref 8.7–10.2)
Chloride: 97 mmol/L (ref 96–106)
Creatinine, Ser: 0.88 mg/dL (ref 0.57–1.00)
Globulin, Total: 3.2 g/dL (ref 1.5–4.5)
Glucose: 329 mg/dL — ABNORMAL HIGH (ref 65–99)
Potassium: 4.5 mmol/L (ref 3.5–5.2)
Sodium: 136 mmol/L (ref 134–144)
Total Protein: 7.1 g/dL (ref 6.0–8.5)
eGFR: 82 mL/min/{1.73_m2} (ref >59)

## 2020-10-18 LAB — HEMOGLOBIN A1C
Est. average glucose Bld gHb Est-mCnc: 246 mg/dL
Hgb A1c MFr Bld: 10.2 % — ABNORMAL HIGH (ref 4.8–5.6)

## 2020-10-18 MED ORDER — NOVOLOG FLEXPEN 100 UNIT/ML ~~LOC~~ SOPN: PEN_INJECTOR | SUBCUTANEOUS | 3 refills | Status: DC

## 2020-10-18 MED ORDER — LANTUS SOLOSTAR 100 UNIT/ML ~~LOC~~ SOPN: 40.0000 [IU] | PEN_INJECTOR | Freq: Every day | SUBCUTANEOUS | 3 refills | Status: DC

## 2020-10-18 MED ORDER — METFORMIN HCL 500 MG PO TABS
1000.0000 mg | ORAL_TABLET | Freq: Two times a day (BID) | ORAL | 2 refills | Status: DC
  Filled 2020-12-04: qty 120, 30d supply, fill #0
  Filled 2021-01-07: qty 120, 30d supply, fill #1

## 2020-11-05 ENCOUNTER — Other Ambulatory Visit: Payer: Self-pay

## 2020-11-16 ENCOUNTER — Other Ambulatory Visit: Payer: Self-pay

## 2020-11-16 MED FILL — Insulin Pen Needle 31 G X 5 MM (1/5" or 3/16"): 25 days supply | Qty: 100 | Fill #0 | Status: AC

## 2020-11-16 MED FILL — Atorvastatin Calcium Tab 10 MG (Base Equivalent): ORAL | 30 days supply | Qty: 30 | Fill #0 | Status: AC

## 2020-11-16 MED FILL — Lisinopril Tab 10 MG: ORAL | 30 days supply | Qty: 30 | Fill #0 | Status: AC

## 2020-11-20 ENCOUNTER — Other Ambulatory Visit: Payer: Self-pay

## 2020-11-23 ENCOUNTER — Other Ambulatory Visit: Payer: Self-pay

## 2020-12-04 ENCOUNTER — Other Ambulatory Visit: Payer: Self-pay

## 2020-12-06 ENCOUNTER — Other Ambulatory Visit: Payer: Self-pay

## 2020-12-13 NOTE — Progress Notes (Signed)
   Covid-19 Vaccination Clinic  Name:  Emily Clay    MRN: [id number] DOB: 08-03-1970  12/13/2020  [name] was observed post Covid-19 immunization for 15 minutes without incident. She was provided with Vaccine Information Sheet and instruction to access the V-Safe system.   [name] was instructed to call 911 with any severe reactions post vaccine: Marland Kitchen Difficulty breathing  . Swelling of face and throat  . A fast heartbeat  . A bad rash all over body  . Dizziness and weakness   Immunizations Administered    Name Date Dose VIS Date Route   Moderna COVID-19 Vaccine 08/24/2020  9:15 AM 0.5 mL 05/16/2020 Intramuscular   Manufacturer: Moderna   Lot: [id number]   NDC: 69485-462-70

## 2020-12-14 ENCOUNTER — Other Ambulatory Visit: Payer: Self-pay

## 2020-12-14 MED FILL — Atorvastatin Calcium Tab 10 MG (Base Equivalent): ORAL | 30 days supply | Qty: 30 | Fill #1 | Status: AC

## 2020-12-14 MED FILL — Insulin Pen Needle 31 G X 5 MM (1/5" or 3/16"): 25 days supply | Qty: 100 | Fill #1 | Status: AC

## 2020-12-14 MED FILL — Lisinopril Tab 10 MG: ORAL | 30 days supply | Qty: 30 | Fill #1 | Status: AC

## 2020-12-18 ENCOUNTER — Other Ambulatory Visit: Payer: Self-pay

## 2021-01-07 ENCOUNTER — Other Ambulatory Visit: Payer: Self-pay

## 2021-01-18 ENCOUNTER — Other Ambulatory Visit: Payer: Self-pay

## 2021-01-18 MED FILL — Lisinopril Tab 10 MG: ORAL | 30 days supply | Qty: 30 | Fill #2 | Status: AC

## 2021-01-18 MED FILL — Insulin Pen Needle 31 G X 5 MM (1/5" or 3/16"): 25 days supply | Qty: 100 | Fill #2 | Status: AC

## 2021-01-18 MED FILL — Atorvastatin Calcium Tab 10 MG (Base Equivalent): ORAL | 30 days supply | Qty: 30 | Fill #2 | Status: AC

## 2021-02-14 ENCOUNTER — Other Ambulatory Visit: Payer: Self-pay | Admitting: Internal Medicine

## 2021-02-14 ENCOUNTER — Other Ambulatory Visit: Payer: Self-pay

## 2021-02-14 ENCOUNTER — Other Ambulatory Visit: Payer: Self-pay | Admitting: Family Medicine

## 2021-02-14 DIAGNOSIS — Z794 Long term (current) use of insulin: Secondary | ICD-10-CM

## 2021-02-14 DIAGNOSIS — E119 Type 2 diabetes mellitus without complications: Secondary | ICD-10-CM

## 2021-02-14 DIAGNOSIS — E1159 Type 2 diabetes mellitus with other circulatory complications: Secondary | ICD-10-CM

## 2021-02-14 MED ORDER — METFORMIN HCL 500 MG PO TABS
1000.0000 mg | ORAL_TABLET | Freq: Two times a day (BID) | ORAL | 0 refills | Status: DC
  Filled 2021-02-14: qty 120, 30d supply, fill #0

## 2021-02-14 MED ORDER — LISINOPRIL 10 MG PO TABS
ORAL_TABLET | Freq: Every day | ORAL | 0 refills | Status: DC
  Filled 2021-02-14: qty 30, 30d supply, fill #0

## 2021-02-14 MED ORDER — ATORVASTATIN CALCIUM 10 MG PO TABS
ORAL_TABLET | Freq: Every day | ORAL | 0 refills | Status: DC
  Filled 2021-02-14: qty 30, 30d supply, fill #0

## 2021-02-14 MED FILL — Insulin Pen Needle 31 G X 5 MM (1/5" or 3/16"): 25 days supply | Qty: 100 | Fill #3 | Status: AC

## 2021-02-15 ENCOUNTER — Other Ambulatory Visit: Payer: Self-pay

## 2021-03-13 ENCOUNTER — Other Ambulatory Visit: Payer: Self-pay | Admitting: Internal Medicine

## 2021-03-13 ENCOUNTER — Other Ambulatory Visit: Payer: Self-pay

## 2021-03-13 DIAGNOSIS — E1159 Type 2 diabetes mellitus with other circulatory complications: Secondary | ICD-10-CM

## 2021-03-13 DIAGNOSIS — Z794 Long term (current) use of insulin: Secondary | ICD-10-CM

## 2021-03-13 DIAGNOSIS — I152 Hypertension secondary to endocrine disorders: Secondary | ICD-10-CM

## 2021-03-13 DIAGNOSIS — E119 Type 2 diabetes mellitus without complications: Secondary | ICD-10-CM

## 2021-03-13 MED FILL — Insulin Pen Needle 31 G X 5 MM (1/5" or 3/16"): 25 days supply | Qty: 100 | Fill #4 | Status: CN

## 2021-03-13 NOTE — Telephone Encounter (Signed)
Requested medications are due for refill today.  yes  Requested medications are on the active medications list.  yes  Last refill. 02/14/2021  Future visit scheduled.   no  Notes to clinic.  Pt has abnormal labs and BP- no follow up appointment.

## 2021-03-15 ENCOUNTER — Other Ambulatory Visit: Payer: Self-pay

## 2021-03-19 ENCOUNTER — Other Ambulatory Visit: Payer: Self-pay

## 2021-03-19 MED ORDER — LISINOPRIL 10 MG PO TABS: 10.0000 mg | ORAL_TABLET | Freq: Every day | ORAL | 0 refills | Status: DC

## 2021-03-19 MED ORDER — METFORMIN HCL 500 MG PO TABS: 1000.0000 mg | ORAL_TABLET | Freq: Two times a day (BID) | ORAL | 0 refills | Status: DC

## 2021-03-19 MED ORDER — ATORVASTATIN CALCIUM 10 MG PO TABS: 10.0000 mg | ORAL_TABLET | Freq: Every day | ORAL | 0 refills | Status: DC

## 2021-03-19 NOTE — Telephone Encounter (Signed)
Refill until appointment on 03/25/21.

## 2021-03-19 NOTE — Telephone Encounter (Signed)
Patient asking for short skupply of meds until her appt on 08/29

## 2021-03-19 NOTE — Addendum Note (Signed)
Addended by: Wilford Corner on: 03/19/2021 05:06 PM   Modules accepted: Orders

## 2021-03-25 ENCOUNTER — Encounter: Payer: Self-pay | Admitting: Internal Medicine

## 2021-03-25 ENCOUNTER — Telehealth: Payer: Self-pay | Admitting: Internal Medicine

## 2021-03-25 ENCOUNTER — Ambulatory Visit: Payer: Self-pay | Attending: Internal Medicine | Admitting: Internal Medicine

## 2021-03-25 ENCOUNTER — Other Ambulatory Visit: Payer: Self-pay

## 2021-03-25 DIAGNOSIS — E1159 Type 2 diabetes mellitus with other circulatory complications: Secondary | ICD-10-CM

## 2021-03-25 DIAGNOSIS — E1169 Type 2 diabetes mellitus with other specified complication: Secondary | ICD-10-CM

## 2021-03-25 DIAGNOSIS — E785 Hyperlipidemia, unspecified: Secondary | ICD-10-CM

## 2021-03-25 DIAGNOSIS — I152 Hypertension secondary to endocrine disorders: Secondary | ICD-10-CM

## 2021-03-25 DIAGNOSIS — E669 Obesity, unspecified: Secondary | ICD-10-CM

## 2021-03-25 MED ORDER — METFORMIN HCL 1000 MG PO TABS
1000.0000 mg | ORAL_TABLET | Freq: Two times a day (BID) | ORAL | 6 refills | Status: DC
Start: 2021-03-25 — End: 2021-10-08
  Filled 2021-03-25 – 2021-09-03 (×3): qty 60, 30d supply, fill #0

## 2021-03-25 MED ORDER — LISINOPRIL 10 MG PO TABS
10.0000 mg | ORAL_TABLET | Freq: Every day | ORAL | 6 refills | Status: DC
  Filled 2021-03-25: qty 30, 30d supply, fill #0

## 2021-03-25 MED ORDER — BASAGLAR KWIKPEN 100 UNIT/ML ~~LOC~~ SOPN
40.0000 [IU] | PEN_INJECTOR | Freq: Every day | SUBCUTANEOUS | 6 refills | Status: DC
  Filled 2021-03-25: qty 30, 75d supply, fill #0
  Filled 2021-06-25 – 2021-08-20 (×2): qty 12, 30d supply, fill #0

## 2021-03-25 MED ORDER — ATORVASTATIN CALCIUM 10 MG PO TABS
10.0000 mg | ORAL_TABLET | Freq: Every day | ORAL | 6 refills | Status: DC
Start: 2021-03-25 — End: 2021-12-11
  Filled 2021-03-25 – 2021-09-03 (×3): qty 30, 30d supply, fill #0
  Filled 2021-10-08: qty 30, 30d supply, fill #1
  Filled 2021-11-06: qty 30, 30d supply, fill #2
  Filled 2021-12-11: qty 30, 30d supply, fill #3

## 2021-03-25 NOTE — Progress Notes (Signed)
Virtual Visit via Telephone Note  I connected with Emily Clay on 03/25/2021 at 9:43 AM by telephone and verified that I am speaking with the correct person using two identifiers  Location: Patient: work Provider: office  Participants: Myself Patient   I discussed the limitations, risks, security and privacy concerns of performing an evaluation and management service by telephone and the availability of in person appointments. I also discussed with the patient that there may be a patient responsible charge related to this service. The patient expressed understanding and agreed to proceed.   History of Present Illness: Patient with history of former tob dep, diabetes type 2, HTN, obesity.  I last saw her 01/2020.  Did tele visit with Dr. Margarita Rana 09/2020.  Purpose of today's visit is chronic disease management.  HYPERTENSION Currently taking: see medication list.  On Lisinopril Med Adherence: [x]  Yes    []  No Medication side effects: []  Yes    [x]  No Adherence with salt restriction: [x]  Yes    []  No Home Monitoring?: []  Yes    [x]  No Monitoring Frequency:  Home BP results range:  SOB? []  Yes    [x]  No Chest Pain?: []  Yes    [x]  No Leg swelling?: []  Yes    [x]  No Headaches?: []  Yes    [x]  No Dizziness? []  Yes    [x]  No Comments:   DIABETES TYPE 2/Obese Last A1C:   Lab Results  Component Value Date   HGBA1C 10.2 (H) 10/16/2020  Med Adherence:  [x]  Yes -Humalog 8 units TID (was twice a day but pt states Dr. Margarita Rana increased to TID with meals), Glargine 40 units daily, Metformin 1 gram BID   Medication side effects:  []  Yes    [x]  No Home Monitoring?  [x]  Yes  1-2x/day most days Home glucose results range: before BF range no higher than 119.   Diet Adherence: [x]  Yes  -doing better.  Eats fried chicken only on Sundays.  Eating less starches.  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  Thinks wgh is about the same but has not wgh self.  Last recorded BMI was 36. Hypoglycemic  episodes?: [x]  Yes  -a few times a wk.  Occurs when she forgets to eat.  Numbness of the feet? []  Yes    [x]  No Retinopathy hx? []  Yes    []  No Last eye exam:  due for eye exam but no insurance Comments:   HL:  tolerating Lipitor.  HM: Due for flu shot, eye exam.  No insurance.  Had only one Moderna vaccine so far.  Plans to get 2nd shot at work.  Due for PAP  Outpatient Encounter Medications as of 03/25/2021  Medication Sig   atorvastatin (LIPITOR) 10 MG tablet Take 1 tablet (10 mg total) by mouth daily. KEEP OFFICE VISIT FOR ADDITIONAL REFILLS   insulin aspart (NOVOLOG FLEXPEN) 100 UNIT/ML FlexPen 8 units subcut with the two largest meals of the day   insulin glargine (LANTUS SOLOSTAR) 100 UNIT/ML Solostar Pen Inject 40 Units into the skin daily.   Insulin Pen Needle 31G X 5 MM MISC USE AS DIRECTED   insulin regular (NOVOLIN R) 100 units/mL injection Inject 0.1 mLs (10 Units total) into the skin 2 (two) times daily.   lisinopril (ZESTRIL) 10 MG tablet Take 1 tablet (10 mg total) by mouth daily. KEEP OFFICE VISIT FOR ADDITIONAL REFILLS   metFORMIN (GLUCOPHAGE) 500 MG tablet Take 2 tablets (1,000 mg total) by mouth 2 (two)  times daily with a meal. KEEP OFFICE VISIT FOR ADDITIONAL REFILLS   TRUEPLUS PEN NEEDLES 31G X 8 MM MISC USE AS DIRECTED   No facility-administered encounter medications on file as of 03/25/2021.      Observations/Objective: No direct observation done as this was a telephone encounter. Results for orders placed or performed in visit on 10/16/20  Hemoglobin A1c  Result Value Ref Range   Hgb A1c MFr Bld 10.2 (H) 4.8 - 5.6 %   Est. average glucose Bld gHb Est-mCnc 246 mg/dL  CMP14+EGFR  Result Value Ref Range   Glucose 329 (H) 65 - 99 mg/dL   BUN 15 6 - 24 mg/dL   Creatinine, Ser 0.88 0.57 - 1.00 mg/dL   eGFR 82 >59 mL/min/1.73   BUN/Creatinine Ratio 17 9 - 23   Sodium 136 134 - 144 mmol/L   Potassium 4.5 3.5 - 5.2 mmol/L   Chloride 97 96 - 106 mmol/L   CO2 21  20 - 29 mmol/L   Calcium 9.5 8.7 - 10.2 mg/dL   Total Protein 7.1 6.0 - 8.5 g/dL   Albumin 3.9 3.8 - 4.8 g/dL   Globulin, Total 3.2 1.5 - 4.5 g/dL   Albumin/Globulin Ratio 1.2 1.2 - 2.2   Bilirubin Total 0.3 0.0 - 1.2 mg/dL   Alkaline Phosphatase 90 44 - 121 IU/L   AST 27 0 - 40 IU/L   ALT 27 0 - 32 IU/L     Assessment and Plan: 1. Diabetes mellitus type 2 in obese [facility]) Reported blood sugar readings before breakfast are at goal.  Continue metformin 1 g twice a day, glargine insulin 40 units daily and NovoLog 8 units with meals.  Encourage patient to try to get in her meals on time and not to take the NovoLog unless she eats.  Encouraged her to try to get in some exercise outside of work. - CBC; Future - Microalbumin / creatinine urine ratio; Future - Hemoglobin A1c; Future - insulin glargine (LANTUS SOLOSTAR) 100 UNIT/ML Solostar Pen; Inject 40 Units into the skin daily.  Dispense: 30 mL; Refill: 6 - metFORMIN (GLUCOPHAGE) 1000 MG tablet; Take 1 tablet (1,000 mg total) by mouth 2 (two) times daily with a meal. KEEP OFFICE VISIT FOR ADDITIONAL REFILLS  Dispense: 60 tablet; Refill: 6  2. Hypertension associated with diabetes ([place]) Continue lisinopril and low-salt diet. - lisinopril (ZESTRIL) 10 MG tablet; Take 1 tablet (10 mg total) by mouth daily.  Dispense: 30 tablet; Refill: 6  3. Hyperlipidemia associated with type 2 diabetes mellitus ([place]) - Lipid panel; Future - atorvastatin (LIPITOR) 10 MG tablet; Take 1 tablet (10 mg total) by mouth daily.  Dispense: 30 tablet; Refill: 6   Follow Up Instructions: 6 wks for pap and flu shot   I discussed the assessment and treatment plan with the patient. The patient was provided an opportunity to ask questions and all were answered. The patient agreed with the plan and demonstrated an understanding of the instructions.   The patient was advised to call back or seek an in-person evaluation if the symptoms worsen or if the condition fails to  improve as anticipated.  I  Spent 19 minutes on this telephone encounter  Karle Plumber, MD

## 2021-03-25 NOTE — Telephone Encounter (Signed)
Called patient to get scheduled for a 6 week appointment with Dr.Johnson for a PAP smear. No answer left vm to call [phone]

## 2021-03-25 NOTE — Telephone Encounter (Signed)
-----   Message from Marcine Matar, MD sent at 03/25/2021 10:01 AM EDT ----- Give appointment in 6 weeks from today for Pap smear.  Patient prefers to have the 410 appointment.

## 2021-03-26 ENCOUNTER — Other Ambulatory Visit: Payer: Self-pay

## 2021-03-27 ENCOUNTER — Other Ambulatory Visit: Payer: Self-pay

## 2021-04-04 ENCOUNTER — Ambulatory Visit: Payer: Self-pay | Attending: Internal Medicine

## 2021-04-04 ENCOUNTER — Other Ambulatory Visit: Payer: Self-pay

## 2021-04-04 DIAGNOSIS — E669 Obesity, unspecified: Secondary | ICD-10-CM

## 2021-04-04 DIAGNOSIS — E1169 Type 2 diabetes mellitus with other specified complication: Secondary | ICD-10-CM

## 2021-04-05 LAB — MICROALBUMIN / CREATININE URINE RATIO
Creatinine, Urine: 89.5 mg/dL
Microalb/Creat Ratio: 541 mg/g creat — ABNORMAL HIGH (ref 0–29)
Microalbumin, Urine: 484.1 ug/mL

## 2021-04-05 LAB — CBC
Hematocrit: 37.5 % (ref 34.0–46.6)
Hemoglobin: 12.6 g/dL (ref 11.1–15.9)
MCH: 31 pg (ref 26.6–33.0)
MCHC: 33.6 g/dL (ref 31.5–35.7)
MCV: 92 fL (ref 79–97)
Platelets: 287 10*3/uL (ref 150–450)
RBC: 4.07 x10E6/uL (ref 3.77–5.28)
RDW: 13 % (ref 11.7–15.4)
WBC: 7.8 10*3/uL (ref 3.4–10.8)

## 2021-04-05 LAB — LIPID PANEL
Chol/HDL Ratio: 2.4 ratio (ref 0.0–4.4)
Cholesterol, Total: 141 mg/dL (ref 100–199)
HDL: 60 mg/dL (ref >39)
LDL Chol Calc (NIH): 64 mg/dL (ref 0–99)
Triglycerides: 93 mg/dL (ref 0–149)
VLDL Cholesterol Cal: 17 mg/dL (ref 5–40)

## 2021-04-05 LAB — HEMOGLOBIN A1C
Est. average glucose Bld gHb Est-mCnc: 189 mg/dL
Hgb A1c MFr Bld: 8.2 % — ABNORMAL HIGH (ref 4.8–5.6)

## 2021-04-06 ENCOUNTER — Other Ambulatory Visit: Payer: Self-pay | Admitting: Internal Medicine

## 2021-04-06 DIAGNOSIS — I152 Hypertension secondary to endocrine disorders: Secondary | ICD-10-CM

## 2021-04-06 DIAGNOSIS — E1159 Type 2 diabetes mellitus with other circulatory complications: Secondary | ICD-10-CM

## 2021-04-06 MED ORDER — LISINOPRIL 10 MG PO TABS
15.0000 mg | ORAL_TABLET | Freq: Every day | ORAL | 6 refills | Status: DC
Start: 2021-04-06 — End: 2021-12-11
  Filled 2021-09-03: qty 45, 30d supply, fill #0
  Filled 2021-10-08: qty 45, 30d supply, fill #1
  Filled 2021-11-06: qty 45, 30d supply, fill #2

## 2021-04-08 ENCOUNTER — Other Ambulatory Visit: Payer: Self-pay

## 2021-05-02 ENCOUNTER — Encounter: Payer: Self-pay | Admitting: Physician Assistant

## 2021-05-02 ENCOUNTER — Other Ambulatory Visit: Payer: Self-pay

## 2021-05-02 ENCOUNTER — Other Ambulatory Visit (HOSPITAL_COMMUNITY)
Admission: RE | Admit: 2021-05-02 | Discharge: 2021-05-02 | Disposition: A | Discharge status: Home / Self Care | Payer: Medicaid Other | Source: Ambulatory Visit | Attending: Physician Assistant | Admitting: Physician Assistant

## 2021-05-02 ENCOUNTER — Ambulatory Visit: Payer: Self-pay | Attending: Physician Assistant | Admitting: Physician Assistant

## 2021-05-02 VITALS — BP 164/82 | HR 79 | Ht 61.0 in | Wt 185.1 lb

## 2021-05-02 DIAGNOSIS — E1169 Type 2 diabetes mellitus with other specified complication: Secondary | ICD-10-CM

## 2021-05-02 DIAGNOSIS — Z124 Encounter for screening for malignant neoplasm of cervix: Secondary | ICD-10-CM | POA: Diagnosis not present

## 2021-05-02 DIAGNOSIS — E669 Obesity, unspecified: Secondary | ICD-10-CM

## 2021-05-02 LAB — GLUCOSE, POCT (MANUAL RESULT ENTRY): POC Glucose: 96 mg/dl (ref 70–99)

## 2021-05-02 NOTE — Progress Notes (Signed)
Emily Clay, is a 50 y.o. female  PNT:[id number]  GQQ:[id number]  DOB - 04-24-1971  Chief Complaint  Patient presents with   Gynecologic Exam       Subjective:   Emily Clay is a 50 y.o. female here today for pap only.  Saw PCP last month.  Some vaginal discharge.  Has BTL for [facility].  No pelvic pain or other issues  No problems updated.  ALLERGIES: No Known Allergies  PAST MEDICAL HISTORY: Past Medical History:  Diagnosis Date   Diabetes mellitus    Hypertension    Proteinuria     MEDICATIONS AT HOME: Prior to Admission medications   Medication Sig Start Date End Date Taking? Authorizing Provider  atorvastatin (LIPITOR) 10 MG tablet Take 1 tablet (10 mg total) by mouth daily. 03/25/21  Yes Marcine Matar, MD  insulin aspart (NOVOLOG FLEXPEN) 100 UNIT/ML FlexPen 8 units subcut with the two largest meals of the day 10/18/20  Yes Newlin, Enobong, MD  insulin glargine (LANTUS SOLOSTAR) 100 UNIT/ML Solostar Pen Inject 40 Units into the skin daily. 03/25/21  Yes Marcine Matar, MD  Insulin Pen Needle 31G X 5 MM MISC USE AS DIRECTED 09/14/20 09/14/21 Yes Marcine Matar, MD  lisinopril (ZESTRIL) 10 MG tablet Take 1.5 tablets (15 mg total) by mouth daily. 04/06/21  Yes Marcine Matar, MD  metFORMIN (GLUCOPHAGE) 1000 MG tablet Take 1 tablet (1,000 mg total) by mouth 2 (two) times daily with a meal. KEEP OFFICE VISIT FOR ADDITIONAL REFILLS 03/25/21  Yes Marcine Matar, MD  TRUEPLUS PEN NEEDLES 31G X 8 MM MISC USE AS DIRECTED 09/14/20  Yes Marcine Matar, MD    ROS: Neg HEENT Neg resp Neg cardiac Neg GI Neg GU Neg MS Neg psych Neg neuro  Objective:   Vitals:   05/02/21 1532  BP: (!) 164/82  Pulse: 79  SpO2: 100%  Weight: 185 lb 2 oz (84 kg)  Height: 5\' 1"  (1.549 m)   Exam General appearance : Awake, alert, not in any distress. Speech Clear. Not toxic looking HEENT: Atraumatic and Normocephalic Neck: Supple, no JVD. No cervical lymphadenopathy.   Chest: Good air entry bilaterally, CTAB.  No rales/rhonchi/wheezing CVS: S1 S2 regular, no murmurs.  GU: external genitalia WNL; vagina wnl; cervix with scant whitish d/c, pap and swab taken. Bimanual exam unremarkable.  . Extremities: B/L Lower Ext shows no edema, both legs are warm to touch Neurology: Awake alert, and oriented X 3, CN II-XII intact, Non focal Skin: No Rash  Data Review Lab Results  Component Value Date   HGBA1C 8.2 (H) 04/04/2021   HGBA1C 10.2 (H) 10/16/2020   HGBA1C 9.7 (A) 01/27/2020    Assessment & Plan   1. Diabetes mellitus type 2 in obese (HCC) Improving control - Glucose (CBG)  2. Screening for cervical cancer - Cytology - PAP([place]) - Cervicovaginal ancillary only  Patient have been counseled extensively about nutrition and exercise. Other issues discussed during this visit include: low cholesterol diet, weight control and daily exercise, foot care, annual eye examinations at Ophthalmology, importance of adherence with medications and regular follow-up. We also discussed long term complications of uncontrolled diabetes and hypertension.   Return in about 3 months (around 08/02/2021) for PCP/ chronic conditions.  The patient was given clear instructions to go to ER or return to medical center if symptoms don't improve, worsen or new problems develop. The patient verbalized understanding. The patient was told to call to get lab results if  they haven't heard anything in the next week.      Georgian Co, PA-C [facility] and [facility]ness [place], [place] [phone]   05/02/2021, 3:54 PM Patient ID: Emily Clay, female   DOB: [birth year], 50 y.o.   MRN: [id number]

## 2021-05-03 LAB — CERVICOVAGINAL ANCILLARY ONLY
Bacterial Vaginitis (gardnerella): NEGATIVE
Candida Glabrata: NEGATIVE
Candida Vaginitis: POSITIVE — AB
Chlamydia: NEGATIVE
Comment: NEGATIVE
Comment: NEGATIVE
Comment: NEGATIVE
Comment: NEGATIVE
Comment: NEGATIVE
Comment: NORMAL
Neisseria Gonorrhea: NEGATIVE
Trichomonas: NEGATIVE

## 2021-05-04 ENCOUNTER — Ambulatory Visit (HOSPITAL_COMMUNITY)
Admission: EM | Admit: 2021-05-04 | Discharge: 2021-05-04 | Disposition: A | Discharge status: Home / Self Care | Payer: Medicaid Other | Attending: Family Medicine | Admitting: Family Medicine

## 2021-05-04 ENCOUNTER — Other Ambulatory Visit: Payer: Self-pay

## 2021-05-04 ENCOUNTER — Encounter (HOSPITAL_COMMUNITY): Payer: Self-pay | Admitting: Emergency Medicine

## 2021-05-04 DIAGNOSIS — N390 Urinary tract infection, site not specified: Secondary | ICD-10-CM | POA: Insufficient documentation

## 2021-05-04 DIAGNOSIS — B3731 Acute candidiasis of vulva and vagina: Secondary | ICD-10-CM | POA: Insufficient documentation

## 2021-05-04 LAB — POCT URINALYSIS DIPSTICK, ED / UC
Bilirubin Urine: NEGATIVE
Glucose, UA: NEGATIVE mg/dL
Ketones, ur: 15 mg/dL — AB
Nitrite: POSITIVE — AB
Protein, ur: >=300 mg/dL — AB
Specific Gravity, Urine: >=1.03 (ref 1.005–1.030)
Urobilinogen, UA: 0.2 mg/dL (ref 0.0–1.0)
pH: 5.5 (ref 5.0–8.0)

## 2021-05-04 LAB — POC URINE PREG, ED: Preg Test, Ur: NEGATIVE

## 2021-05-04 MED ORDER — CEPHALEXIN 500 MG PO CAPS: 500.0000 mg | ORAL_CAPSULE | Freq: Two times a day (BID) | ORAL | 0 refills | Status: DC

## 2021-05-04 MED ORDER — FLUCONAZOLE 150 MG PO TABS: 150.0000 mg | ORAL_TABLET | Freq: Every day | ORAL | 0 refills | Status: DC

## 2021-05-04 NOTE — ED Provider Notes (Signed)
[facility]    CSN: [id number] Arrival date & time: 05/04/21  1518      History   Chief Complaint Chief Complaint  Patient presents with   Urinary Frequency    HPI Emily Clay is a 50 y.o. female.   Patient presenting today with 1 week history of urinary frequency, suprapubic pressure.  She denies hematuria, dysuria, fever, chills, flank pain, nausea or vomiting.  No concern for STD or pregnancy, LMP was 04/13/2021.  Not trying anything over-the-counter for symptoms.  Also states that she was recently tested for vaginal symptoms at her PCPs office and got her results 2 days ago that she has a yeast infection.  She has not yet received a call from her office or medication for this and would like some medication today for this.   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      Home Medications    Prior to Admission medications   Medication Sig Start Date End Date Taking? Authorizing Provider  cephALEXin (KEFLEX) 500 MG capsule Take 1 capsule (500 mg total) by mouth 2 (two) times daily. 05/04/21  Yes Particia Nearing, PA-C  fluconazole (DIFLUCAN) 150 MG tablet Take 1 tablet (150 mg total) by mouth daily. 05/04/21  Yes Particia Nearing, PA-C  atorvastatin (LIPITOR) 10 MG tablet Take 1 tablet (10 mg total) by mouth daily. 03/25/21   Marcine Matar, MD  insulin aspart (NOVOLOG FLEXPEN) 100 UNIT/ML FlexPen 8 units subcut with the two largest meals of the day 10/18/20   Hoy Register, MD  insulin glargine (LANTUS SOLOSTAR) 100 UNIT/ML Solostar Pen Inject 40 Units into the skin daily. 03/25/21   Marcine Matar, MD  Insulin Pen Needle 31G X 5 MM MISC USE AS DIRECTED 09/14/20 09/14/21  Marcine Matar, MD  lisinopril (ZESTRIL) 10 MG tablet Take 1.5 tablets (15 mg total) by mouth daily. 04/06/21   Marcine Matar, MD  metFORMIN (GLUCOPHAGE) 1000 MG tablet Take 1 tablet (1,000 mg total) by mouth 2 (two) times daily with a meal. KEEP OFFICE VISIT FOR ADDITIONAL REFILLS 03/25/21   Marcine Matar, MD  TRUEPLUS PEN NEEDLES 31G X 8 MM MISC USE AS DIRECTED 09/14/20   Marcine Matar, MD    Family History Family History  Problem Relation Age of Onset   Diabetes Mother    Hypertension Mother    Cancer Mother    Diabetes Sister    Hypertension Sister    Hypertension Brother    Diabetes Maternal Aunt     Social History Social History   Tobacco Use   Smoking status: Former    Types: Cigarettes   Smokeless tobacco: Never  Vaping Use   Vaping Use: Never used  Substance Use Topics   Alcohol use: Yes    Alcohol/week: 4.0 standard  drinks    Types: 4 Cans of beer per week   Drug use: No     Allergies   Patient has no known allergies.   Review of Systems Review of Systems Per HPI  Physical Exam Triage Vital Signs ED Triage Vitals  Enc Vitals Group     BP --      Pulse Rate 05/04/21 1624 77     Resp 05/04/21 1624 18     Temp 05/04/21 1624 98.4 F (36.9 C)     Temp src --      SpO2 05/04/21 1624 98 %     Weight --      Height --      Head Circumference --      Peak Flow --      Pain Score 05/04/21 1626 0     Pain Loc --      Pain Edu? --      Excl. in GC? --    No data found.  Updated Vital Signs Pulse 77   Temp 98.4 F (36.9 C)   Resp 18   LMP 04/13/2021 (Approximate)   SpO2 98%   Visual  Acuity Right Eye Distance:   Left Eye Distance:   Bilateral Distance:    Right Eye Near:   Left Eye Near:    Bilateral Near:     Physical Exam Vitals and nursing note reviewed.  Constitutional:      Appearance: Normal appearance. She is not ill-appearing.  HENT:     Head: Atraumatic.  Eyes:     Extraocular Movements: Extraocular movements intact.     Conjunctiva/sclera: Conjunctivae normal.  Cardiovascular:     Rate and Rhythm: Normal rate and regular rhythm.     Heart sounds: Normal heart sounds.  Pulmonary:     Effort: Pulmonary effort is normal.     Breath sounds: Normal breath sounds.  Abdominal:     General: Bowel sounds are normal. There is no distension.     Palpations: Abdomen is soft.     Tenderness: There is no abdominal tenderness. There is no guarding.  Musculoskeletal:        General: Normal range of motion.     Cervical back: Normal range of motion and neck supple.  Skin:    General: Skin is warm and dry.  Neurological:     Mental Status: She is alert and oriented to person, place, and time.  Psychiatric:        Mood and Affect: Mood normal.        Thought Content: Thought content normal.        Judgment: Judgment normal.     UC Treatments / Results  Labs (all labs ordered are listed, but only abnormal results are displayed) Labs Reviewed  POCT URINALYSIS DIPSTICK, ED / UC - Abnormal; Notable for the following components:      Result Value   Ketones, ur 15 (*)    Hgb urine dipstick MODERATE (*)    Protein, ur >=300 (*)    Nitrite POSITIVE (*)    Leukocytes,Ua SMALL (*)    All other components within normal limits  URINE CULTURE  POC URINE PREG, ED    EKG   Radiology No results found.  Procedures Procedures (including critical care time)  Medications Ordered in UC Medications - No data to display  Initial Impression / Assessment and Plan / UC Course  I have reviewed the triage vital signs and the nursing notes.  Pertinent  labs &  imaging results that were available during my care of the patient were reviewed by me and considered in my medical decision making (see chart for details).     UA with evidence of a urinary tract infection, exam and vitals reassuring.  We will treat with Keflex while awaiting urine culture and treat with Diflucan for yeast vaginitis confirmed on cytology swab several days ago from PCPs office, results available in chart review.  Follow-up for worsening symptoms.  Final Clinical Impressions(s) / UC Diagnoses   Final diagnoses:  Acute lower UTI  Yeast vaginitis   Discharge Instructions   None    ED Prescriptions     Medication Sig Dispense Auth. Provider   cephALEXin (KEFLEX) 500 MG capsule Take 1 capsule (500 mg total) by mouth 2 (two) times daily. 10 capsule Particia Nearing, PA-C   fluconazole (DIFLUCAN) 150 MG tablet Take 1 tablet (150 mg total) by mouth daily. 1 tablet Particia Nearing, [place]      PDMP not reviewed this encounter.   Particia Nearing, [place] 05/04/21 1718

## 2021-05-04 NOTE — ED Triage Notes (Signed)
Pt is present today with urine frequency, vaginal discharge, and bladder pressure. Pt states that she noticed the sx last week

## 2021-05-06 LAB — URINE CULTURE: Culture: >=100000 — AB

## 2021-05-06 LAB — CYTOLOGY - PAP
Adequacy: ABSENT
Comment: NEGATIVE
Diagnosis: NEGATIVE
High risk HPV: NEGATIVE

## 2021-05-08 ENCOUNTER — Other Ambulatory Visit: Payer: Self-pay | Admitting: Physician Assistant

## 2021-05-08 MED ORDER — FLUCONAZOLE 150 MG PO TABS: 150.0000 mg | ORAL_TABLET | Freq: Every day | ORAL | 0 refills | Status: DC

## 2021-06-25 ENCOUNTER — Other Ambulatory Visit: Payer: Self-pay

## 2021-06-25 ENCOUNTER — Other Ambulatory Visit: Payer: Self-pay | Admitting: Pharmacist

## 2021-06-25 ENCOUNTER — Other Ambulatory Visit: Payer: Self-pay | Admitting: Internal Medicine

## 2021-06-25 DIAGNOSIS — E119 Type 2 diabetes mellitus without complications: Secondary | ICD-10-CM

## 2021-06-25 DIAGNOSIS — Z794 Long term (current) use of insulin: Secondary | ICD-10-CM

## 2021-06-25 MED ORDER — INSULIN LISPRO (1 UNIT DIAL) 100 UNIT/ML (KWIKPEN)
PEN_INJECTOR | SUBCUTANEOUS | 2 refills | Status: DC
  Filled 2021-06-25 – 2021-08-08 (×2): qty 6, 38d supply, fill #0

## 2021-06-27 ENCOUNTER — Other Ambulatory Visit: Payer: Self-pay

## 2021-07-01 ENCOUNTER — Other Ambulatory Visit: Payer: Self-pay

## 2021-07-08 ENCOUNTER — Other Ambulatory Visit: Payer: Self-pay

## 2021-07-10 ENCOUNTER — Other Ambulatory Visit: Payer: Self-pay

## 2021-08-09 ENCOUNTER — Other Ambulatory Visit: Payer: Self-pay

## 2021-08-20 ENCOUNTER — Other Ambulatory Visit: Payer: Self-pay

## 2021-08-21 ENCOUNTER — Other Ambulatory Visit: Payer: Self-pay

## 2021-09-02 ENCOUNTER — Other Ambulatory Visit: Payer: Self-pay

## 2021-09-03 ENCOUNTER — Other Ambulatory Visit: Payer: Self-pay

## 2021-09-03 MED FILL — Insulin Pen Needle 31 G X 5 MM (1/5" or 3/16"): 25 days supply | Qty: 100 | Fill #0 | Status: AC

## 2021-10-08 ENCOUNTER — Other Ambulatory Visit: Payer: Self-pay

## 2021-10-08 ENCOUNTER — Other Ambulatory Visit: Payer: Self-pay | Admitting: Internal Medicine

## 2021-10-08 DIAGNOSIS — E1169 Type 2 diabetes mellitus with other specified complication: Secondary | ICD-10-CM

## 2021-10-08 MED ORDER — METFORMIN HCL 1000 MG PO TABS
1000.0000 mg | ORAL_TABLET | Freq: Two times a day (BID) | ORAL | 0 refills | Status: DC
  Filled 2021-10-08: qty 60, 30d supply, fill #0

## 2021-10-08 NOTE — Telephone Encounter (Signed)
Insulin needles size differs from med list. ?Requested Prescriptions  ?Pending Prescriptions Disp Refills  ?? Insulin Pen Needle (B-D UF III MINI PEN NEEDLES) 31G X 5 MM MISC 100 each 6  ?  Sig: USE AS DIRECTED  ?  ? Endocrinology: Diabetes - Testing Supplies Passed - 10/08/2021  2:50 PM  ?  ?  Passed - Valid encounter within last 12 months  ?  Recent Outpatient Visits   ?      ? 5 months ago Diabetes mellitus type 2 in obese [facility])  ? [place], [place], [place]  ? 6 months ago Diabetes mellitus type 2 in obese [facility])  ? [place], [place]  ? 11 months ago Type 2 diabetes mellitus without complication, with long-term current use of insulin ([place])  ? [place], MD  ? 1 year ago Need for vaccination against Streptococcus pneumoniae  ? [place], RPH-CPP  ? 1 year ago Type 2 diabetes mellitus without complication, with long-term current use of insulin ([place])  ? [place] Lad[place], MD  ?  ?  ? ?  ?  ?  ?? metFORMIN (GLUCOPHAGE) 1000 MG tablet 60 tablet 6  ?  Sig: Take 1 tablet (1,000 mg total) by mouth 2 (two) times daily with a meal. KEEP OFFICE VISIT FOR ADDITIONAL REFILLS  ?  ? Endocrinology:  Diabetes - Biguanides Failed - 10/08/2021  2:50 PM  ?  ?  Failed - HBA1C is between 0 and 7.9 and within 180 days  ?  HbA1c, POC (controlled diabetic range)  ?Date Value Ref Range Status  ?01/27/2020 9.7 (A) 0.0 - 7.0 % Final  ? ?Hgb A1c MFr Bld  ?Date Value Ref Range Status  ?04/04/2021 8.2 (H) 4.8 - 5.6 % Final  ?  Comment:  ?           Prediabetes: 5.7 - 6.4 ?         Diabetes: >6.4 ?         Glycemic control for adults with diabetes: <7.0 ?  ?   ?  ?  Failed - B12 Level in normal range and within 720 days  ?  No results found for: VITAMINB12   ?  ?  Failed - CBC within normal limits and completed in the  last 12 months  ?  WBC  ?Date Value Ref Range Status  ?04/04/2021 7.8 3.4 - 10.8 x10E3/uL Final  ?07/25/2013 5.0 4.0 - 10.5 K/uL Final  ? ?RBC  ?Date Value Ref Range Status  ?04/04/2021 4.07 3.77 - 5.28 x10E6/uL Final  ?07/25/2013 4.28 3.87 - 5.11 MIL/uL Final  ? ?Hemoglobin  ?Date Value Ref Range Status  ?04/04/2021 12.6 11.1 - 15.9 g/dL Final  ? ?Hematocrit  ?Date Value Ref Range Status  ?04/04/2021 37.5 34.0 - 46.6 % Final  ? ?MCHC  ?Date Value Ref Range Status  ?04/04/2021 33.6 31.5 - 35.7 g/dL Final  ?07/25/2013 33.7 30.0 - 36.0 g/dL Final  ? ?MCH  ?Date Value Ref Range Status  ?04/04/2021 31.0 26.6 - 33.0 pg Final  ?07/25/2013 30.8 26.0 - 34.0 pg Final  ? ?MCV  ?Date Value Ref Range Status  ?04/04/2021 92 79 - 97 fL Final  ? ?No results found for: PLTCOUNTKUC, LABPLAT, [place] ?RDW  ?Date Value Ref Range Status  ?04/04/2021 13.0  11.7 - 15.4 % Final  ? ?  ?  ?  Passed - Cr in normal range and within 360 days  ?  Creat  ?Date Value Ref Range Status  ?07/25/2013 0.70 0.50 - 1.10 mg/dL Final  ? ?Creatinine, Ser  ?Date Value Ref Range Status  ?10/16/2020 0.88 0.57 - 1.00 mg/dL Final  ?   ?  ?  Passed - eGFR in normal range and within 360 days  ?  GFR, Est African American  ?Date Value Ref Range Status  ?07/25/2013 >89 mL/min Final  ? ?GFR calc Af Amer  ?Date Value Ref Range Status  ?01/27/2020 92 >59 mL/min/1.73 Final  ?  Comment:  ?  **[facility] currently reports eGFR in compliance with the current** ?  recommendations of the [employer]. [facility] will ?  update reporting as new guidelines are published from the NKF-ASN ?  Task force. ?  ? ?GFR, Est Non African American  ?Date Value Ref Range Status  ?07/25/2013 >89 mL/min Final  ?  Comment:  ?    ?The estimated GFR is a calculation valid for adults (>=36 years old) ?that uses the CKD-EPI algorithm to adjust for age and sex. It is   ?not to be used for children, pregnant women, hospitalized patients,    ?patients on dialysis, or with rapidly changing  kidney function. ?According to the NKDEP, eGFR >89 is normal, 60-89 shows mild ?impairment, 30-59 shows moderate impairment, 15-29 shows severe ?impairment and <15 is ESRD. ?   ? ?GFR calc non Af Amer  ?Date Value Ref Range Status  ?01/27/2020 80 >59 mL/min/1.73 Final  ? ?eGFR  ?Date Value Ref Range Status  ?10/16/2020 82 >59 mL/min/1.73 Final  ?   ?  ?  Passed - Valid encounter within last 6 months  ?  Recent Outpatient Visits   ?      ? 5 months ago Diabetes mellitus type 2 in obese [facility])  ? [place], [place], [place]  ? 6 months ago Diabetes mellitus type 2 in obese [facility])  ? [place] Ladell Pier, MD  ? 11 months ago Type 2 diabetes mellitus without complication, with long-term current use of insulin ([place])  ? [place], [place]  ? 1 year ago Need for vaccination against Streptococcus pneumoniae  ? [place], RPH-CPP  ? 1 year ago Type 2 diabetes mellitus without complication, with long-term current use of insulin ([place])  ? [place] Ladell Pier, MD  ?  ?  ? ?  ?  ?  ? ? ?

## 2021-10-09 ENCOUNTER — Encounter: Payer: Self-pay | Admitting: Internal Medicine

## 2021-10-09 ENCOUNTER — Other Ambulatory Visit: Payer: Self-pay

## 2021-10-09 ENCOUNTER — Other Ambulatory Visit: Payer: Self-pay | Admitting: Internal Medicine

## 2021-10-09 MED ORDER — FLUCONAZOLE 150 MG PO TABS: 150.0000 mg | ORAL_TABLET | Freq: Every day | ORAL | 0 refills | Status: DC

## 2021-11-06 ENCOUNTER — Other Ambulatory Visit: Payer: Self-pay

## 2021-11-06 ENCOUNTER — Other Ambulatory Visit: Payer: Self-pay | Admitting: Internal Medicine

## 2021-11-06 DIAGNOSIS — E1169 Type 2 diabetes mellitus with other specified complication: Secondary | ICD-10-CM

## 2021-11-07 ENCOUNTER — Other Ambulatory Visit: Payer: Self-pay

## 2021-12-09 ENCOUNTER — Other Ambulatory Visit: Payer: Self-pay

## 2021-12-11 ENCOUNTER — Other Ambulatory Visit: Payer: Self-pay | Admitting: Pharmacist

## 2021-12-11 ENCOUNTER — Telehealth: Payer: Medicaid Other | Admitting: Physician Assistant

## 2021-12-11 ENCOUNTER — Other Ambulatory Visit: Payer: Self-pay

## 2021-12-11 ENCOUNTER — Other Ambulatory Visit: Payer: Self-pay | Admitting: Internal Medicine

## 2021-12-11 DIAGNOSIS — E785 Hyperlipidemia, unspecified: Secondary | ICD-10-CM

## 2021-12-11 DIAGNOSIS — E669 Obesity, unspecified: Secondary | ICD-10-CM

## 2021-12-11 DIAGNOSIS — I152 Hypertension secondary to endocrine disorders: Secondary | ICD-10-CM

## 2021-12-11 DIAGNOSIS — R3989 Other symptoms and signs involving the genitourinary system: Secondary | ICD-10-CM

## 2021-12-11 MED ORDER — SULFAMETHOXAZOLE-TRIMETHOPRIM 800-160 MG PO TABS
1.0000 | ORAL_TABLET | Freq: Two times a day (BID) | ORAL | 0 refills | Status: DC
  Filled 2021-12-11: qty 10, 5d supply, fill #0

## 2021-12-11 MED ORDER — INSULIN LISPRO (1 UNIT DIAL) 100 UNIT/ML (KWIKPEN): PEN_INJECTOR | SUBCUTANEOUS | 0 refills | Status: DC

## 2021-12-11 MED ORDER — BASAGLAR KWIKPEN 100 UNIT/ML ~~LOC~~ SOPN: 40.0000 [IU] | PEN_INJECTOR | Freq: Every day | SUBCUTANEOUS | 0 refills | Status: DC

## 2021-12-11 NOTE — Progress Notes (Signed)

## 2021-12-11 NOTE — Addendum Note (Signed)
Addended by: Erie Noe on: 12/11/2021 05:47 PM ? ? Modules accepted: Orders ? ?

## 2021-12-11 NOTE — Telephone Encounter (Signed)
Requesting this as well, scheduled next available.  ?atorvastatin (LIPITOR) 10 MG tablet ?

## 2021-12-11 NOTE — Telephone Encounter (Signed)
Pt scheduled next available with PCP (august 29th) she needs refills to last her until then or she needs to be worked in for sooner. She says she is open to doing that if is possible bc she is completely out of her metformin, lisinopril, and atorvastatin.  ? ? ?

## 2021-12-12 ENCOUNTER — Other Ambulatory Visit: Payer: Self-pay

## 2021-12-12 ENCOUNTER — Telehealth: Payer: Medicaid Other | Admitting: Family Medicine

## 2021-12-12 DIAGNOSIS — M25441 Effusion, right hand: Secondary | ICD-10-CM

## 2021-12-12 MED ORDER — ATORVASTATIN CALCIUM 10 MG PO TABS
10.0000 mg | ORAL_TABLET | Freq: Every day | ORAL | 6 refills | Status: DC
Start: 2021-12-12 — End: 2022-09-07
  Filled 2022-01-07: qty 30, 30d supply, fill #0
  Filled 2022-02-02: qty 30, 30d supply, fill #1
  Filled 2022-03-08: qty 30, 30d supply, fill #2
  Filled 2022-04-08: qty 30, 30d supply, fill #3
  Filled 2022-05-04 – 2022-05-15 (×3): qty 30, 30d supply, fill #4
  Filled 2022-06-16: qty 30, 30d supply, fill #5
  Filled 2022-07-18 – 2022-08-04 (×3): qty 30, 30d supply, fill #6

## 2021-12-12 MED ORDER — LISINOPRIL 10 MG PO TABS
15.0000 mg | ORAL_TABLET | Freq: Every day | ORAL | 6 refills | Status: DC
  Filled 2021-12-12: qty 45, 30d supply, fill #0
  Filled 2022-01-07 (×2): qty 45, 30d supply, fill #1
  Filled 2022-02-02 – 2022-02-03 (×2): qty 45, 30d supply, fill #2
  Filled 2022-03-08: qty 45, 30d supply, fill #3

## 2021-12-12 MED ORDER — METFORMIN HCL 1000 MG PO TABS
1000.0000 mg | ORAL_TABLET | Freq: Two times a day (BID) | ORAL | 0 refills | Status: DC
  Filled 2021-12-12: qty 60, 30d supply, fill #0

## 2021-12-12 NOTE — Progress Notes (Signed)
[place]   Chronic nature of on going finger swelling without known injury or recent trauma- recommended in person eval.  Message detailed sent to pt on this EV

## 2021-12-12 NOTE — Telephone Encounter (Signed)
Requested Prescriptions  Pending Prescriptions Disp Refills  . metFORMIN (GLUCOPHAGE) 1000 MG tablet 60 tablet 0    Sig: Take 1 tablet (1,000 mg total) by mouth 2 (two) times daily with a meal. KEEP OFFICE VISIT FOR ADDITIONAL REFILLS     Endocrinology:  Diabetes - Biguanides Failed - 12/11/2021  5:47 PM      Failed - Cr in normal range and within 360 days    Creat  Date Value Ref Range Status  07/25/2013 0.70 0.50 - 1.10 mg/dL Final   Creatinine, Ser  Date Value Ref Range Status  10/16/2020 0.88 0.57 - 1.00 mg/dL Final         Failed - HBA1C is between 0 and 7.9 and within 180 days    HbA1c, POC (controlled diabetic range)  Date Value Ref Range Status  01/27/2020 9.7 (A) 0.0 - 7.0 % Final   Hgb A1c MFr Bld  Date Value Ref Range Status  04/04/2021 8.2 (H) 4.8 - 5.6 % Final    Comment:             Prediabetes: 5.7 - 6.4          Diabetes: >6.4          Glycemic control for adults with diabetes: <7.0          Failed - eGFR in normal range and within 360 days    GFR, Est African American  Date Value Ref Range Status  07/25/2013 >89 mL/min Final   GFR calc Af Amer  Date Value Ref Range Status  01/27/2020 92 >59 mL/min/1.73 Final    Comment:    **[facility] currently reports eGFR in compliance with the current**   recommendations of the [employer]. [facility] will   update reporting as new guidelines are published from the NKF-ASN   Task force.    GFR, Est Non African American  Date Value Ref Range Status  07/25/2013 >89 mL/min Final    Comment:      The estimated GFR is a calculation valid for adults (>=54 years old) that uses the CKD-EPI algorithm to adjust for age and sex. It is   not to be used for children, pregnant women, hospitalized patients,    patients on dialysis, or with rapidly changing kidney function. According to the NKDEP, eGFR >89 is normal, 60-89 shows mild impairment, 30-59 shows moderate impairment, 15-29 shows severe impairment and  <15 is ESRD.     GFR calc non Af Amer  Date Value Ref Range Status  01/27/2020 80 >59 mL/min/1.73 Final   eGFR  Date Value Ref Range Status  10/16/2020 82 >59 mL/min/1.73 Final         Failed - B12 Level in normal range and within 720 days    No results found for: VITAMINB12       Failed - Valid encounter within last 6 months    Recent Outpatient Visits          7 months ago Diabetes mellitus type 2 in obese [facility])   [place], [place], [place]   8 months ago Diabetes mellitus type 2 in obese [facility])   [facility] Karle Plumber B, MD   1 year ago Type 2 diabetes mellitus without complication, with long-term current use of insulin ([place])   [place], Enobong, MD   1 year ago Need for vaccination against Streptococcus pneumoniae   [place]  [place], RPH-CPP   1 year ago Type 2 diabetes mellitus without complication, with long-term current use of insulin ([place])   [place] Ladell Pier, MD      Future Appointments            In 3 months Wynetta Emery Dalbert Batman, MD [place]           Failed - CBC within normal limits and completed in the last 12 months    WBC  Date Value Ref Range Status  04/04/2021 7.8 3.4 - 10.8 x10E3/uL Final  07/25/2013 5.0 4.0 - 10.5 K/uL Final   RBC  Date Value Ref Range Status  04/04/2021 4.07 3.77 - 5.28 x10E6/uL Final  07/25/2013 4.28 3.87 - 5.11 MIL/uL Final   Hemoglobin  Date Value Ref Range Status  04/04/2021 12.6 11.1 - 15.9 g/dL Final   Hematocrit  Date Value Ref Range Status  04/04/2021 37.5 34.0 - 46.6 % Final   MCHC  Date Value Ref Range Status  04/04/2021 33.6 31.5 - 35.7 g/dL Final  07/25/2013 33.7 30.0 - 36.0 g/dL Final   [facility]  Date Value Ref Range Status  04/04/2021 31.0 26.6 - 33.0 pg Final  07/25/2013  30.8 26.0 - 34.0 pg Final   MCV  Date Value Ref Range Status  04/04/2021 92 79 - 97 fL Final   No results found for: PLTCOUNTKUC, LABPLAT, POCPLA RDW  Date Value Ref Range Status  04/04/2021 13.0 11.7 - 15.4 % Final         . atorvastatin (LIPITOR) 10 MG tablet 30 tablet 6    Sig: Take 1 tablet (10 mg total) by mouth daily.     Cardiovascular:  Antilipid - Statins Failed - 12/11/2021  5:47 PM      Failed - Lipid Panel in normal range within the last 12 months    Cholesterol, Total  Date Value Ref Range Status  04/04/2021 141 100 - 199 mg/dL Final   LDL Chol Calc (NIH)  Date Value Ref Range Status  04/04/2021 64 0 - 99 mg/dL Final   HDL  Date Value Ref Range Status  04/04/2021 60 >39 mg/dL Final   Triglycerides  Date Value Ref Range Status  04/04/2021 93 0 - 149 mg/dL Final         Passed - Patient is not pregnant      Passed - Valid encounter within last 12 months    Recent Outpatient Visits          7 months ago Diabetes mellitus type 2 in obese [facility])   [place], [place], [place]   8 months ago Diabetes mellitus type 2 in obese [facility])   [place], Deborah B, MD   1 year ago Type 2 diabetes mellitus without complication, with long-term current use of insulin ([place])   [place], Enobong, MD   1 year ago Need for vaccination against Streptococcus pneumoniae   [place], Jarome Matin, RPH-CPP   1 year ago Type 2 diabetes mellitus without complication, with long-term current use of insulin [facility])   [place], MD      Future Appointments            In 3 months Wynetta Emery Dalbert Batman, MD [facility]  [place]           . lisinopril (ZESTRIL) 10 MG tablet 45 tablet 6    Sig: Take 1.5 tablets (15 mg total) by mouth daily.     Cardiovascular:   ACE Inhibitors Failed - 12/11/2021  5:47 PM      Failed - Cr in normal range and within 180 days    Creat  Date Value Ref Range Status  07/25/2013 0.70 0.50 - 1.10 mg/dL Final   Creatinine, Ser  Date Value Ref Range Status  10/16/2020 0.88 0.57 - 1.00 mg/dL Final         Failed - K in normal range and within 180 days    Potassium  Date Value Ref Range Status  10/16/2020 4.5 3.5 - 5.2 mmol/L Final         Failed - Last BP in normal range    BP Readings from Last 1 Encounters:  05/02/21 (!) 164/82         Failed - Valid encounter within last 6 months    Recent Outpatient Visits          7 months ago Diabetes mellitus type 2 in obese [facility])   [place], [place], [place]   8 months ago Diabetes mellitus type 2 in obese [facility])   [place], Deborah B, MD   1 year ago Type 2 diabetes mellitus without complication, with long-term current use of insulin ([place])   [place], Enobong, MD   1 year ago Need for vaccination against Streptococcus pneumoniae   [place], Jarome Matin, RPH-CPP   1 year ago Type 2 diabetes mellitus without complication, with long-term current use of insulin [facility])   [place], MD      Future Appointments            In 3 months Wynetta Emery Dalbert Batman, MD [place] - Patient is not pregnant      Refused Prescriptions Disp Refills  . lisinopril (ZESTRIL) 10 MG tablet 45 tablet 6    Sig: Take 1.5 tablets (15 mg total) by mouth daily.     Cardiovascular:  ACE Inhibitors Failed - 12/11/2021  5:47 PM      Failed - Cr in normal range and within 180 days    Creat  Date Value Ref Range Status  07/25/2013 0.70 0.50 - 1.10 mg/dL Final   Creatinine, Ser  Date Value Ref Range Status  10/16/2020 0.88 0.57 - 1.00  mg/dL Final         Failed - K in normal range and within 180 days    Potassium  Date Value Ref Range Status  10/16/2020 4.5 3.5 - 5.2 mmol/L Final         Failed - Last BP in normal range    BP Readings from Last 1 Encounters:  05/02/21 (!) 164/82         Failed - Valid encounter within last 6 months    Recent Outpatient Visits          7 months ago Diabetes mellitus type 2 in obese [facility])   [place], [place], [place]   8 months ago Diabetes mellitus type 2 in obese (  [facility])   [place] Karle Plumber B, MD   1 year ago Type 2 diabetes mellitus without complication, with long-term current use of insulin [facility])   [place], Enobong, MD   1 year ago Need for vaccination against Streptococcus pneumoniae   [place], Jarome Matin, RPH-CPP   1 year ago Type 2 diabetes mellitus without complication, with long-term current use of insulin [facility])   [place], MD      Future Appointments            In 3 months Wynetta Emery Dalbert Batman, MD [place] - Patient is not pregnant      . metFORMIN (GLUCOPHAGE) 1000 MG tablet 60 tablet 0    Sig: Take 1 tablet (1,000 mg total) by mouth 2 (two) times daily with a meal. KEEP OFFICE VISIT FOR ADDITIONAL REFILLS     Endocrinology:  Diabetes - Biguanides Failed - 12/11/2021  5:47 PM      Failed - Cr in normal range and within 360 days    Creat  Date Value Ref Range Status  07/25/2013 0.70 0.50 - 1.10 mg/dL Final   Creatinine, Ser  Date Value Ref Range Status  10/16/2020 0.88 0.57 - 1.00 mg/dL Final         Failed - HBA1C is between 0 and 7.9 and within 180 days    HbA1c, POC (controlled diabetic range)  Date Value Ref Range Status  01/27/2020 9.7 (A) 0.0 - 7.0 % Final   Hgb A1c MFr Bld  Date  Value Ref Range Status  04/04/2021 8.2 (H) 4.8 - 5.6 % Final    Comment:             Prediabetes: 5.7 - 6.4          Diabetes: >6.4          Glycemic control for adults with diabetes: <7.0          Failed - eGFR in normal range and within 360 days    GFR, Est African American  Date Value Ref Range Status  07/25/2013 >89 mL/min Final   GFR calc Af Amer  Date Value Ref Range Status  01/27/2020 92 >59 mL/min/1.73 Final    Comment:    **[facility] currently reports eGFR in compliance with the current**   recommendations of the [employer]. [facility] will   update reporting as new guidelines are published from the NKF-ASN   Task force.    GFR, Est Non African American  Date Value Ref Range Status  07/25/2013 >89 mL/min Final    Comment:      The estimated GFR is a calculation valid for adults (>=24 years old) that uses the CKD-EPI algorithm to adjust for age and sex. It is   not to be used for children, pregnant women, hospitalized patients,    patients on dialysis, or with rapidly changing kidney function. According to the NKDEP, eGFR >89 is normal, 60-89 shows mild impairment, 30-59 shows moderate impairment, 15-29 shows severe impairment and <15 is ESRD.     GFR calc non Af Amer  Date Value Ref Range Status  01/27/2020 80 >59 mL/min/1.73 Final   eGFR  Date Value Ref Range Status  10/16/2020 82 >59 mL/min/1.73 Final  Failed - B12 Level in normal range and within 720 days    No results found for: VITAMINB12       Failed - Valid encounter within last 6 months    Recent Outpatient Visits          7 months ago Diabetes mellitus type 2 in obese [facility])   [place], [place], [place]   8 months ago Diabetes mellitus type 2 in obese [facility])   [place] Karle Plumber B, MD   1 year ago Type 2 diabetes mellitus without complication, with long-term current use of insulin ([place])   [place], Enobong, MD   1 year ago Need for vaccination against Streptococcus pneumoniae   [place], Jarome Matin, RPH-CPP   1 year ago Type 2 diabetes mellitus without complication, with long-term current use of insulin ([place])   [place] Ladell Pier, MD      Future Appointments            In 3 months Ladell Pier, MD [place]           Failed - CBC within normal limits and completed in the last 12 months    WBC  Date Value Ref Range Status  04/04/2021 7.8 3.4 - 10.8 x10E3/uL Final  07/25/2013 5.0 4.0 - 10.5 K/uL Final   RBC  Date Value Ref Range Status  04/04/2021 4.07 3.77 - 5.28 x10E6/uL Final  07/25/2013 4.28 3.87 - 5.11 MIL/uL Final   Hemoglobin  Date Value Ref Range Status  04/04/2021 12.6 11.1 - 15.9 g/dL Final   Hematocrit  Date Value Ref Range Status  04/04/2021 37.5 34.0 - 46.6 % Final   MCHC  Date Value Ref Range Status  04/04/2021 33.6 31.5 - 35.7 g/dL Final  07/25/2013 33.7 30.0 - 36.0 g/dL Final   [facility]  Date Value Ref Range Status  04/04/2021 31.0 26.6 - 33.0 pg Final  07/25/2013 30.8 26.0 - 34.0 pg Final   MCV  Date Value Ref Range Status  04/04/2021 92 79 - 97 fL Final   No results found for: PLTCOUNTKUC, LABPLAT, POCPLA RDW  Date Value Ref Range Status  04/04/2021 13.0 11.7 - 15.4 % Final

## 2021-12-13 ENCOUNTER — Other Ambulatory Visit: Payer: Self-pay

## 2021-12-19 ENCOUNTER — Other Ambulatory Visit: Payer: Self-pay

## 2022-01-07 ENCOUNTER — Other Ambulatory Visit: Payer: Self-pay | Admitting: Internal Medicine

## 2022-01-07 ENCOUNTER — Other Ambulatory Visit: Payer: Self-pay

## 2022-01-07 DIAGNOSIS — E1169 Type 2 diabetes mellitus with other specified complication: Secondary | ICD-10-CM

## 2022-01-07 MED ORDER — METFORMIN HCL 1000 MG PO TABS
1000.0000 mg | ORAL_TABLET | Freq: Two times a day (BID) | ORAL | 0 refills | Status: DC
  Filled 2022-01-07: qty 60, 30d supply, fill #0

## 2022-01-07 NOTE — Telephone Encounter (Signed)
Appointment 03/25/22 Requested Prescriptions  Pending Prescriptions Disp Refills  . metFORMIN (GLUCOPHAGE) 1000 MG tablet 60 tablet 0    Sig: Take 1 tablet (1,000 mg total) by mouth 2 (two) times daily with a meal. KEEP OFFICE VISIT FOR ADDITIONAL REFILLS     Endocrinology:  Diabetes - Biguanides Failed - 01/07/2022 10:22 AM      Failed - Cr in normal range and within 360 days    Creat  Date Value Ref Range Status  07/25/2013 0.70 0.50 - 1.10 mg/dL Final   Creatinine, Ser  Date Value Ref Range Status  10/16/2020 0.88 0.57 - 1.00 mg/dL Final         Failed - HBA1C is between 0 and 7.9 and within 180 days    HbA1c, POC (controlled diabetic range)  Date Value Ref Range Status  01/27/2020 9.7 (A) 0.0 - 7.0 % Final   Hgb A1c MFr Bld  Date Value Ref Range Status  04/04/2021 8.2 (H) 4.8 - 5.6 % Final    Comment:             Prediabetes: 5.7 - 6.4          Diabetes: >6.4          Glycemic control for adults with diabetes: <7.0          Failed - eGFR in normal range and within 360 days    GFR, Est African American  Date Value Ref Range Status  07/25/2013 >89 mL/min Final   GFR calc Af Amer  Date Value Ref Range Status  01/27/2020 92 >59 mL/min/1.73 Final    Comment:    **[facility] currently reports eGFR in compliance with the current**   recommendations of the [employer]. [facility] will   update reporting as new guidelines are published from the NKF-ASN   Task force.    GFR, Est Non African American  Date Value Ref Range Status  07/25/2013 >89 mL/min Final    Comment:      The estimated GFR is a calculation valid for adults (>=74 years old) that uses the CKD-EPI algorithm to adjust for age and sex. It is   not to be used for children, pregnant women, hospitalized patients,    patients on dialysis, or with rapidly changing kidney function. According to the NKDEP, eGFR >89 is normal, 60-89 shows mild impairment, 30-59 shows moderate impairment, 15-29 shows  severe impairment and <15 is ESRD.     GFR calc non Af Amer  Date Value Ref Range Status  01/27/2020 80 >59 mL/min/1.73 Final   eGFR  Date Value Ref Range Status  10/16/2020 82 >59 mL/min/1.73 Final         Failed - B12 Level in normal range and within 720 days    No results found for: "VITAMINB12"       Failed - Valid encounter within last 6 months    Recent Outpatient Visits          8 months ago Diabetes mellitus type 2 in obese [facility])   [place], [place], [place]   9 months ago Diabetes mellitus type 2 in obese [facility])   [facility] Karle Plumber B, MD   1 year ago Type 2 diabetes mellitus without complication, with long-term current use of insulin ([place])   [place], Charlane Ferretti, MD   1 year ago Need for vaccination against Streptococcus pneumoniae   [facility]  [place], RPH-CPP   1 year ago Type 2 diabetes mellitus without complication, with long-term current use of insulin ([place])   [place] Ladell Pier, MD      Future Appointments            In 2 months Ladell Pier, MD [place]           Failed - CBC within normal limits and completed in the last 12 months    WBC  Date Value Ref Range Status  04/04/2021 7.8 3.4 - 10.8 x10E3/uL Final  07/25/2013 5.0 4.0 - 10.5 K/uL Final   RBC  Date Value Ref Range Status  04/04/2021 4.07 3.77 - 5.28 x10E6/uL Final  07/25/2013 4.28 3.87 - 5.11 MIL/uL Final   Hemoglobin  Date Value Ref Range Status  04/04/2021 12.6 11.1 - 15.9 g/dL Final   Hematocrit  Date Value Ref Range Status  04/04/2021 37.5 34.0 - 46.6 % Final   MCHC  Date Value Ref Range Status  04/04/2021 33.6 31.5 - 35.7 g/dL Final  07/25/2013 33.7 30.0 - 36.0 g/dL Final   [facility]  Date Value Ref Range Status  04/04/2021 31.0 26.6 - 33.0  pg Final  07/25/2013 30.8 26.0 - 34.0 pg Final   MCV  Date Value Ref Range Status  04/04/2021 92 79 - 97 fL Final   No results found for: "PLTCOUNTKUC", "LABPLAT", "POCPLA" RDW  Date Value Ref Range Status  04/04/2021 13.0 11.7 - 15.4 % Final

## 2022-01-08 MED ORDER — INSULIN LISPRO (1 UNIT DIAL) 100 UNIT/ML (KWIKPEN): PEN_INJECTOR | SUBCUTANEOUS | 1 refills | Status: DC

## 2022-01-10 ENCOUNTER — Other Ambulatory Visit: Payer: Self-pay | Admitting: Internal Medicine

## 2022-01-10 MED ORDER — INSULIN LISPRO (1 UNIT DIAL) 100 UNIT/ML (KWIKPEN)
PEN_INJECTOR | SUBCUTANEOUS | 0 refills | Status: DC
  Filled 2022-01-10: qty 6, 25d supply, fill #0
  Filled 2022-02-02: qty 6, 25d supply, fill #1
  Filled 2022-03-08: qty 3, 13d supply, fill #2

## 2022-01-13 ENCOUNTER — Other Ambulatory Visit: Payer: Self-pay

## 2022-01-15 ENCOUNTER — Other Ambulatory Visit: Payer: Self-pay | Admitting: Pharmacist

## 2022-01-15 NOTE — Chronic Care Management (AMB) (Signed)
Patient attempted to be outreached by Azucena Freed, PharmD Candidate on 01/15/22 to discuss hypertension.   Patient does not have an automated home blood pressure machine.   Medication review was performed. They are not taking medications as prescribed- reports taking Humalog twice daily due to pharmacy supply issue that was resolved today  The following barriers to adherence were noted: - Needing Refills  The following interventions were completed:  - Medications were reviewed - Patient was educated on proper technique to check home blood pressure and reminded to bring home machine and readings to next provider appointment - Patient was counseled on lifestyle modifications to improve blood pressure  The patient has follow up scheduled:  PCP: 03/25/22   Catie Eppie Gibson, PharmD, [facility] [phone]

## 2022-02-02 ENCOUNTER — Other Ambulatory Visit: Payer: Self-pay | Admitting: Internal Medicine

## 2022-02-02 DIAGNOSIS — E1169 Type 2 diabetes mellitus with other specified complication: Secondary | ICD-10-CM

## 2022-02-03 ENCOUNTER — Other Ambulatory Visit: Payer: Self-pay

## 2022-02-03 MED ORDER — METFORMIN HCL 1000 MG PO TABS
1000.0000 mg | ORAL_TABLET | Freq: Two times a day (BID) | ORAL | 0 refills | Status: DC
  Filled 2022-02-03: qty 32, 16d supply, fill #0

## 2022-02-03 MED ORDER — BASAGLAR KWIKPEN 100 UNIT/ML ~~LOC~~ SOPN
40.0000 [IU] | PEN_INJECTOR | Freq: Every day | SUBCUTANEOUS | 0 refills | Status: DC
  Filled 2022-02-03: qty 15, 37d supply, fill #0

## 2022-02-03 NOTE — Telephone Encounter (Signed)
Courtesy refill given, appointment needed.  Requested Prescriptions  Pending Prescriptions Disp Refills  . metFORMIN (GLUCOPHAGE) 1000 MG tablet 32 tablet 0    Sig: Take 1 tablet (1,000 mg total) by mouth 2 (two) times daily with a meal. (KEEP OFFICE VISIT FOR ADDITIONAL REFILLS)     Endocrinology:  Diabetes - Biguanides Failed - 02/02/2022 11:12 AM      Failed - Cr in normal range and within 360 days    Creat  Date Value Ref Range Status  07/25/2013 0.70 0.50 - 1.10 mg/dL Final   Creatinine, Ser  Date Value Ref Range Status  10/16/2020 0.88 0.57 - 1.00 mg/dL Final         Failed - HBA1C is between 0 and 7.9 and within 180 days    HbA1c, POC (controlled diabetic range)  Date Value Ref Range Status  01/27/2020 9.7 (A) 0.0 - 7.0 % Final   Hgb A1c MFr Bld  Date Value Ref Range Status  04/04/2021 8.2 (H) 4.8 - 5.6 % Final    Comment:             Prediabetes: 5.7 - 6.4          Diabetes: >6.4          Glycemic control for adults with diabetes: <7.0          Failed - eGFR in normal range and within 360 days    GFR, Est African American  Date Value Ref Range Status  07/25/2013 >89 mL/min Final   GFR calc Af Amer  Date Value Ref Range Status  01/27/2020 92 >59 mL/min/1.73 Final    Comment:    **[facility] currently reports eGFR in compliance with the current**   recommendations of the [employer]. [facility] will   update reporting as new guidelines are published from the NKF-ASN   Task force.    GFR, Est Non African American  Date Value Ref Range Status  07/25/2013 >89 mL/min Final    Comment:      The estimated GFR is a calculation valid for adults (>=36 years old) that uses the CKD-EPI algorithm to adjust for age and sex. It is   not to be used for children, pregnant women, hospitalized patients,    patients on dialysis, or with rapidly changing kidney function. According to the NKDEP, eGFR >89 is normal, 60-89 shows mild impairment, 30-59 shows moderate  impairment, 15-29 shows severe impairment and <15 is ESRD.     GFR calc non Af Amer  Date Value Ref Range Status  01/27/2020 80 >59 mL/min/1.73 Final   eGFR  Date Value Ref Range Status  10/16/2020 82 >59 mL/min/1.73 Final         Failed - B12 Level in normal range and within 720 days    No results found for: "VITAMINB12"       Failed - Valid encounter within last 6 months    Recent Outpatient Visits          9 months ago Diabetes mellitus type 2 in obese [facility])   [place], [place], [place]   10 months ago Diabetes mellitus type 2 in obese [facility])   [facility] Karle Plumber B, MD   1 year ago Type 2 diabetes mellitus without complication, with long-term current use of insulin ([place])   [place] [facility] Charlott Rakes, MD   1 year ago Need for vaccination against Streptococcus  pneumoniae   [place], Jarome Matin, RPH-CPP   2 years ago Type 2 diabetes mellitus without complication, with long-term current use of insulin ([place])   [place], MD      Future Appointments            In 1 month Ladell Pier, MD [place]           Failed - CBC within normal limits and completed in the last 12 months    WBC  Date Value Ref Range Status  04/04/2021 7.8 3.4 - 10.8 x10E3/uL Final  07/25/2013 5.0 4.0 - 10.5 K/uL Final   RBC  Date Value Ref Range Status  04/04/2021 4.07 3.77 - 5.28 x10E6/uL Final  07/25/2013 4.28 3.87 - 5.11 MIL/uL Final   Hemoglobin  Date Value Ref Range Status  04/04/2021 12.6 11.1 - 15.9 g/dL Final   Hematocrit  Date Value Ref Range Status  04/04/2021 37.5 34.0 - 46.6 % Final   MCHC  Date Value Ref Range Status  04/04/2021 33.6 31.5 - 35.7 g/dL Final  07/25/2013 33.7 30.0 - 36.0 g/dL Final   [facility]  Date Value Ref Range Status   04/04/2021 31.0 26.6 - 33.0 pg Final  07/25/2013 30.8 26.0 - 34.0 pg Final   MCV  Date Value Ref Range Status  04/04/2021 92 79 - 97 fL Final   No results found for: "PLTCOUNTKUC", "LABPLAT", "POCPLA" RDW  Date Value Ref Range Status  04/04/2021 13.0 11.7 - 15.4 % Final

## 2022-02-06 ENCOUNTER — Other Ambulatory Visit: Payer: Self-pay

## 2022-02-21 ENCOUNTER — Other Ambulatory Visit: Payer: Self-pay | Admitting: Internal Medicine

## 2022-02-21 ENCOUNTER — Other Ambulatory Visit: Payer: Self-pay

## 2022-02-21 DIAGNOSIS — E669 Obesity, unspecified: Secondary | ICD-10-CM

## 2022-02-22 ENCOUNTER — Other Ambulatory Visit: Payer: Self-pay | Admitting: Internal Medicine

## 2022-02-22 DIAGNOSIS — E1169 Type 2 diabetes mellitus with other specified complication: Secondary | ICD-10-CM

## 2022-02-22 MED ORDER — METFORMIN HCL 1000 MG PO TABS
1000.0000 mg | ORAL_TABLET | Freq: Two times a day (BID) | ORAL | 0 refills | Status: DC
  Filled 2022-02-22: qty 60, 30d supply, fill #0

## 2022-02-24 ENCOUNTER — Other Ambulatory Visit: Payer: Self-pay

## 2022-02-25 ENCOUNTER — Other Ambulatory Visit: Payer: Self-pay

## 2022-03-08 ENCOUNTER — Other Ambulatory Visit: Payer: Self-pay | Admitting: Internal Medicine

## 2022-03-08 DIAGNOSIS — E1169 Type 2 diabetes mellitus with other specified complication: Secondary | ICD-10-CM

## 2022-03-10 ENCOUNTER — Other Ambulatory Visit: Payer: Self-pay

## 2022-03-10 MED ORDER — BASAGLAR KWIKPEN 100 UNIT/ML ~~LOC~~ SOPN
40.0000 [IU] | PEN_INJECTOR | Freq: Every day | SUBCUTANEOUS | 0 refills | Status: DC
  Filled 2022-03-10: qty 12, 30d supply, fill #0

## 2022-03-10 NOTE — Telephone Encounter (Signed)
Requested medication (s) are due for refill today: -  Requested medication (s) are on the active medication list: yes  Last refill:  02/03/22 15 ml  Future visit scheduled: yes  Notes to clinic:  overdue Hgb A1C    Requested Prescriptions  Pending Prescriptions Disp Refills   Insulin Glargine (BASAGLAR KWIKPEN) 100 UNIT/ML 15 mL 0    Sig: Inject 40 Units into the skin daily.     Endocrinology:  Diabetes - Insulins Failed - 03/08/2022  2:35 PM      Failed - HBA1C is between 0 and 7.9 and within 180 days    HbA1c, POC (controlled diabetic range)  Date Value Ref Range Status  01/27/2020 9.7 (A) 0.0 - 7.0 % Final   Hgb A1c MFr Bld  Date Value Ref Range Status  04/04/2021 8.2 (H) 4.8 - 5.6 % Final    Comment:             Prediabetes: 5.7 - 6.4          Diabetes: >6.4          Glycemic control for adults with diabetes: <7.0          Failed - Valid encounter within last 6 months    Recent Outpatient Visits           10 months ago Diabetes mellitus type 2 in obese [facility])   [facility] [place], [place], [place]   11 months ago Diabetes mellitus type 2 in obese [facility])   [place] [facility] Jonah Blue B, MD   1 year ago Type 2 diabetes mellitus without complication, with long-term current use of insulin (H[facility])   [place] [facility] [place], Odette Horns, MD   2 years ago Need for vaccination against Streptococcus pneumoniae   [facility] [place], Cornelius Moras, RPH-CPP   2 years ago Type 2 diabetes mellitus without complication, with long-term current use of insulin [facility])   [place] [facility] Marcine Matar, MD       Future Appointments             In 2 weeks Marcine Matar, MD [facility]

## 2022-03-17 ENCOUNTER — Telehealth: Payer: Medicaid Other | Admitting: Physician Assistant

## 2022-03-17 ENCOUNTER — Telehealth: Payer: Self-pay | Admitting: Physician Assistant

## 2022-03-17 DIAGNOSIS — M545 Low back pain, unspecified: Secondary | ICD-10-CM

## 2022-03-17 DIAGNOSIS — R300 Dysuria: Secondary | ICD-10-CM

## 2022-03-17 DIAGNOSIS — N3000 Acute cystitis without hematuria: Secondary | ICD-10-CM

## 2022-03-17 MED ORDER — MELOXICAM 15 MG PO TABS
15.0000 mg | ORAL_TABLET | Freq: Every day | ORAL | 0 refills | Status: DC
Start: 2022-03-17 — End: 2022-12-25

## 2022-03-17 MED ORDER — CYCLOBENZAPRINE HCL 10 MG PO TABS
5.0000 mg | ORAL_TABLET | Freq: Three times a day (TID) | ORAL | 0 refills | Status: DC | PRN
Start: 2022-03-17 — End: 2022-07-18

## 2022-03-17 MED ORDER — SULFAMETHOXAZOLE-TRIMETHOPRIM 800-160 MG PO TABS
1.0000 | ORAL_TABLET | Freq: Two times a day (BID) | ORAL | 0 refills | Status: DC
Start: 2022-03-17 — End: 2022-07-18

## 2022-03-17 NOTE — Progress Notes (Signed)
Virtual Visit Consent   Emily Clay, you are scheduled for a virtual visit with a [facility] provider today. Just as with appointments in the office, your consent must be obtained to participate. Your consent will be active for this visit and any virtual visit you may have with one of our providers in the next 365 days. If you have a MyChart account, a copy of this consent can be sent to you electronically.  As this is a virtual visit, video technology does not allow for your provider to perform a traditional examination. This may limit your provider's ability to fully assess your condition. If your provider identifies any concerns that need to be evaluated in person or the need to arrange testing (such as labs, EKG, etc.), we will make arrangements to do so. Although advances in technology are sophisticated, we cannot ensure that it will always work on either your end or our end. If the connection with a video visit is poor, the visit may have to be switched to a telephone visit. With either a video or telephone visit, we are not always able to ensure that we have a secure connection.  By engaging in this virtual visit, you consent to the provision of healthcare and authorize for your insurance to be billed (if applicable) for the services provided during this visit. Depending on your insurance coverage, you may receive a charge related to this service.  I need to obtain your verbal consent now. Are you willing to proceed with your visit today? [name] has provided verbal consent on 03/17/2022 for a virtual visit (video or telephone). Margaretann Loveless, PA-C  Date: 03/17/2022 5:34 PM  Virtual Visit via Video Note   I, Margaretann Loveless, connected with  Emily Clay  ([id number], 10/31/1970) on 03/17/22 at  5:15 PM EDT by a video-enabled telemedicine application and verified that I am speaking with the correct person using two identifiers.  Location: Patient: Virtual Visit Location  Patient: Home Provider: Virtual Visit Location Provider: Home Office   I discussed the limitations of evaluation and management by telemedicine and the availability of in person appointments. The patient expressed understanding and agreed to proceed.    History of Present Illness: Emily Clay is a 51 y.o. who identifies as a female who was assigned female at birth, and is being seen today for back pain and stomach pain.  HPI: Back Pain This is a new problem. The current episode started 1 to 4 weeks ago. The problem occurs constantly. The problem has been gradually worsening since onset. The pain is present in the lumbar spine. The quality of the pain is described as aching and burning. The pain does not radiate. The pain is moderate. The pain is The same all the time. The symptoms are aggravated by bending. Stiffness is present All day. Associated symptoms include dysuria. Pertinent negatives include no bladder incontinence, bowel incontinence, fever, headaches, numbness, paresis, paresthesias or weakness. The treatment provided no relief.  Urinary Tract Infection  This is a new problem. The current episode started in the past 7 days. The problem occurs every urination. The problem has been gradually worsening. The quality of the pain is described as aching. The pain is mild. There has been no fever. She is Not sexually active. There is A history of pyelonephritis. Associated symptoms include frequency, hesitancy and urgency. Pertinent negatives include no chills, flank pain, hematuria, nausea, possible pregnancy or vomiting. She has tried acetaminophen and increased fluids  for the symptoms. The treatment provided no relief.    Does have appt with PCP on 03/25/22.  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) 07/25/2013   Muscle strain 07/25/2013    Allergies: No Known Allergies Medications:  Current Outpatient Medications:    cyclobenzaprine (FLEXERIL) 10 MG tablet, Take 0.5-1 tablets (5-10 mg total) by mouth 3 (three) times daily as needed., Disp: 30 tablet, Rfl: 0   meloxicam (MOBIC) 15 MG tablet, Take 1 tablet (15 mg total) by mouth daily., Disp: 30 tablet, Rfl: 0   sulfamethoxazole-trimethoprim (BACTRIM DS) 800-160 MG tablet, Take 1 tablet by mouth 2 (two) times daily., Disp: 10 tablet, Rfl: 0   atorvastatin (LIPITOR) 10 MG tablet, Take 1 tablet (10 mg total) by mouth once daily., Disp: 30 tablet, Rfl: 6   Insulin Glargine (BASAGLAR KWIKPEN) 100 UNIT/ML, Inject 40 Units into the skin daily., Disp: 12 mL, Rfl: 0   insulin lispro (HUMALOG KWIKPEN) 100 UNIT/ML KwikPen, inject 8 units subcutaneously three times a day with meals.  Needs to be seen prior to next refill request., Disp: 15 mL, Rfl: 0   lisinopril (ZESTRIL) 10 MG tablet, Take 1.5 tablets (15 mg total) by mouth daily., Disp: 45 tablet, Rfl: 6   metFORMIN (GLUCOPHAGE) 1000 MG tablet, Take 1 tablet (1,000 mg total) by mouth 2 (two) times daily with a meal. (KEEP OFFICE VISIT FOR ADDITIONAL REFILLS), Disp: 60 tablet, Rfl: 0   TRUEPLUS PEN NEEDLES 31G X 8 MM MISC, USE AS DIRECTED, Disp: 100 each, Rfl: 6  Observations/Objective: Patient is well-developed, well-nourished in no acute distress.  Resting comfortably at home.  Head is normocephalic, atraumatic.  No labored breathing.  Speech is clear and coherent with logical content.  Patient is alert and oriented at baseline.    Assessment and Plan: 1. Acute midline low back pain without sciatica - cyclobenzaprine (FLEXERIL) 10 MG tablet; Take 0.5-1 tablets (5-10 mg total) by mouth 3 (three) times daily as  needed.  Dispense: 30 tablet; Refill: 0 - meloxicam (MOBIC) 15 MG tablet; Take 1 tablet (15 mg total) by mouth daily.  Dispense: 30 tablet; Refill: 0  2. Acute cystitis without hematuria - sulfamethoxazole-trimethoprim (BACTRIM DS) 800-160 MG tablet; Take 1 tablet by mouth 2 (two) times daily.  Dispense: 10 tablet; Refill: 0  - Discussed possibility of kidney stone, low suspicion of pyelonephritis due to not having flank pain, fever, chills, nausea, vomiting - Will treat for back strain and UTI with flexeril, mobic, and bactrim - Push fluids - Heating pad - Light stretches for back - Tylenol as needed - Seek in person evaluation if not improving or if symptoms worsen   Follow Up Instructions: I discussed the assessment and treatment plan with the patient. The patient was provided an opportunity to ask questions and all were answered. The patient agreed with the plan and demonstrated an understanding of the instructions.  A copy of instructions were sent to the patient via MyChart unless otherwise noted below.    The patient was advised to call back or seek an in-person evaluation if the symptoms worsen or if the condition fails to improve as anticipated.  Time:  I spent 25 minutes with the patient via telehealth technology discussing the above problems/concerns.    Margaretann Loveless, PA-C

## 2022-03-17 NOTE — Patient Instructions (Signed)
Emily Clay, thank you for joining Margaretann Loveless, PA-C for today's virtual visit.  While this provider is not your primary care provider (PCP), if your PCP is located in our provider database this encounter information will be shared with them immediately following your visit.  Consent: (Patient) Emily Clay provided verbal consent for this virtual visit at the beginning of the encounter.  Current Medications:  Current Outpatient Medications:    cyclobenzaprine (FLEXERIL) 10 MG tablet, Take 0.5-1 tablets (5-10 mg total) by mouth 3 (three) times daily as needed., Disp: 30 tablet, Rfl: 0   meloxicam (MOBIC) 15 MG tablet, Take 1 tablet (15 mg total) by mouth daily., Disp: 30 tablet, Rfl: 0   sulfamethoxazole-trimethoprim (BACTRIM DS) 800-160 MG tablet, Take 1 tablet by mouth 2 (two) times daily., Disp: 10 tablet, Rfl: 0   atorvastatin (LIPITOR) 10 MG tablet, Take 1 tablet (10 mg total) by mouth once daily., Disp: 30 tablet, Rfl: 6   Insulin Glargine (BASAGLAR KWIKPEN) 100 UNIT/ML, Inject 40 Units into the skin daily., Disp: 12 mL, Rfl: 0   insulin lispro (HUMALOG KWIKPEN) 100 UNIT/ML KwikPen, inject 8 units subcutaneously three times a day with meals.  Needs to be seen prior to next refill request., Disp: 15 mL, Rfl: 0   lisinopril (ZESTRIL) 10 MG tablet, Take 1.5 tablets (15 mg total) by mouth daily., Disp: 45 tablet, Rfl: 6   metFORMIN (GLUCOPHAGE) 1000 MG tablet, Take 1 tablet (1,000 mg total) by mouth 2 (two) times daily with a meal. (KEEP OFFICE VISIT FOR ADDITIONAL REFILLS), Disp: 60 tablet, Rfl: 0   TRUEPLUS PEN NEEDLES 31G X 8 MM MISC, USE AS DIRECTED, Disp: 100 each, Rfl: 6   Medications ordered in this encounter:  Meds ordered this encounter  Medications   cyclobenzaprine (FLEXERIL) 10 MG tablet    Sig: Take 0.5-1 tablets (5-10 mg total) by mouth 3 (three) times daily as needed.    Dispense:  30 tablet    Refill:  0    Order Specific Question:   Supervising Provider     Answer:   Hyacinth Meeker, BRIAN [[id number]]   meloxicam (MOBIC) 15 MG tablet    Sig: Take 1 tablet (15 mg total) by mouth daily.    Dispense:  30 tablet    Refill:  0    Order Specific Question:   Supervising Provider    Answer:   Hyacinth Meeker, BRIAN [[id number]]   sulfamethoxazole-trimethoprim (BACTRIM DS) 800-160 MG tablet    Sig: Take 1 tablet by mouth 2 (two) times daily.    Dispense:  10 tablet    Refill:  0    Order Specific Question:   Supervising Provider    Answer:   Hyacinth Meeker, BRIAN [[id number]]     *If you need refills on other medications prior to your next appointment, please contact your pharmacy*  Follow-Up: Call back or seek an in-person evaluation if the symptoms worsen or if the condition fails to improve as anticipated.  Other Instructions Back Exercises These exercises help to make your trunk and back strong. They also help to keep the lower back flexible. Doing these exercises can help to prevent or lessen pain in your lower back. If you have back pain, try to do these exercises 2-3 times each day or as told by your doctor. As you get better, do the exercises once each day. Repeat the exercises more often as told by your doctor. To stop back pain from coming back, do the exercises once each  day, or as told by your doctor. Do exercises exactly as told by your doctor. Stop right away if you feel sudden pain or your pain gets worse. Exercises Single knee to chest Do these steps 3-5 times in a row for each leg: Lie on your back on a firm bed or the floor with your legs stretched out. Bring one knee to your chest. Grab your knee or thigh with both hands and hold it in place. Pull on your knee until you feel a gentle stretch in your lower back or butt. Keep doing the stretch for 10-30 seconds. Slowly let go of your leg and straighten it. Pelvic tilt Do these steps 5-10 times in a row: Lie on your back on a firm bed or the floor with your legs stretched out. Bend your knees so they point up to the  ceiling. Your feet should be flat on the floor. Tighten your lower belly (abdomen) muscles to press your lower back against the floor. This will make your tailbone point up to the ceiling instead of pointing down to your feet or the floor. Stay in this position for 5-10 seconds while you gently tighten your muscles and breathe evenly. Cat-cow Do these steps until your lower back bends more easily: Get on your hands and knees on a firm bed or the floor. Keep your hands under your shoulders, and keep your knees under your hips. You may put padding under your knees. Let your head hang down toward your chest. Tighten (contract) the muscles in your belly. Point your tailbone toward the floor so your lower back becomes rounded like the back of a cat. Stay in this position for 5 seconds. Slowly lift your head. Let the muscles of your belly relax. Point your tailbone up toward the ceiling so your back forms a sagging arch like the back of a cow. Stay in this position for 5 seconds.  Press-ups Do these steps 5-10 times in a row: Lie on your belly (face-down) on a firm bed or the floor. Place your hands near your head, about shoulder-width apart. While you keep your back relaxed and keep your hips on the floor, slowly straighten your arms to raise the top half of your body and lift your shoulders. Do not use your back muscles. You may change where you place your hands to make yourself more comfortable. Stay in this position for 5 seconds. Keep your back relaxed. Slowly return to lying flat on the floor.  Bridges Do these steps 10 times in a row: Lie on your back on a firm bed or the floor. Bend your knees so they point up to the ceiling. Your feet should be flat on the floor. Your arms should be flat at your sides, next to your body. Tighten your butt muscles and lift your butt off the floor until your waist is almost as high as your knees. If you do not feel the muscles working in your butt and the  back of your thighs, slide your feet 1-2 inches (2.5-5 cm) farther away from your butt. Stay in this position for 3-5 seconds. Slowly lower your butt to the floor, and let your butt muscles relax. If this exercise is too easy, try doing it with your arms crossed over your chest. Belly crunches Do these steps 5-10 times in a row: Lie on your back on a firm bed or the floor with your legs stretched out. Bend your knees so they point up to the ceiling.  Your feet should be flat on the floor. Cross your arms over your chest. Tip your chin a little bit toward your chest, but do not bend your neck. Tighten your belly muscles and slowly raise your chest just enough to lift your shoulder blades a tiny bit off the floor. Avoid raising your body higher than that because it can put too much stress on your lower back. Slowly lower your chest and your head to the floor. Back lifts Do these steps 5-10 times in a row: Lie on your belly (face-down) with your arms at your sides, and rest your forehead on the floor. Tighten the muscles in your legs and your butt. Slowly lift your chest off the floor while you keep your hips on the floor. Keep the back of your head in line with the curve in your back. Look at the floor while you do this. Stay in this position for 3-5 seconds. Slowly lower your chest and your face to the floor. Contact a doctor if: Your back pain gets a lot worse when you do an exercise. Your back pain does not get better within 2 hours after you exercise. If you have any of these problems, stop doing the exercises. Do not do them again unless your doctor says it is okay. Get help right away if: You have sudden, very bad back pain. If this happens, stop doing the exercises. Do not do them again unless your doctor says it is okay. This information is not intended to replace advice given to you by your health care provider. Make sure you discuss any questions you have with your health care  provider. Document Revised: 09/26/2020 Document Reviewed: 09/26/2020 Elsevier Patient Education  2023 Elsevier Inc.    If you have been instructed to have an in-person evaluation today at a local Urgent Care facility, please use the link below. It will take you to a list of all of our available [facility], including address, phone number and hours of operation. Please do not delay care.  [facility]  If you or a family member do not have a primary care provider, use the link below to schedule a visit and establish care. When you choose a [facility] primary care physician or advanced practice provider, you gain a long-term partner in health. Find a Primary Care Provider  Learn more about [facility]'s in-office and virtual care options: [facility] - Get Care Now

## 2022-03-17 NOTE — Progress Notes (Signed)
Because of having difficulty urinating with back pain, I feel your condition warrants further evaluation and I recommend that you be seen in a face to face visit. You need to be evaluated in person to rule out more severe issues such as kidney infection(pyelonephritis) or a kidney stone.   NOTE: There will be NO CHARGE for this eVisit   If you are having a true medical emergency please call 911.      For an urgent face to face visit, [facility] has seven urgent care centers for your convenience:     [facility] at [facility] Directions [phone] [address] [place], [place] [address]    [facility] Urgent Care Center [facility]) Get Driving Directions [phone] [address] [place], [place] [address]  [facility] Health Urgent Care Center [facility]) Get Driving Directions [phone] [address] [place],  [place]  [address]  [facility] - at [employer] Directions  [phone] [id number] [place],  [place] [address]   [facility] Urgent Care at [facility] Get Driving Directions [phone] [address] [place], [place] [address]   [facility] Urgent Care at [facility] Get Driving Directions  [phone] [address] [place], [place] [address]   [facility] Urgent Care at [facility] Directions [phone] [address] [place], [place] [address]  Your MyChart E-visit questionnaire answers were reviewed by a board certified advanced clinical practitioner to complete your personal care plan based on your specific symptoms.  Thank you for using e-Visits.   I provided 5 minutes of non face-to-face time during this encounter for chart review and documentation.

## 2022-03-20 ENCOUNTER — Other Ambulatory Visit: Payer: Self-pay | Admitting: Internal Medicine

## 2022-03-21 ENCOUNTER — Other Ambulatory Visit: Payer: Self-pay | Admitting: Internal Medicine

## 2022-03-21 NOTE — Telephone Encounter (Signed)
Requested medication (s) are due for refill today: yes  Requested medication (s) are on the active medication list: yes  Last refill:  01/10/22  Future visit scheduled: yes scheduled for 03/25/22  Notes to clinic:  Unable to refill per protocol due to failed labs, no updated results.      Requested Prescriptions  Pending Prescriptions Disp Refills   insulin lispro (HUMALOG KWIKPEN) 100 UNIT/ML KwikPen 15 mL 0    Sig: inject 8 units subcutaneously three times a day with meals.  Needs to be seen prior to next refill request.     Endocrinology:  Diabetes - Insulins Failed - 03/21/2022 12:23 PM      Failed - HBA1C is between 0 and 7.9 and within 180 days    HbA1c, POC (controlled diabetic range)  Date Value Ref Range Status  01/27/2020 9.7 (A) 0.0 - 7.0 % Final   Hgb A1c MFr Bld  Date Value Ref Range Status  04/04/2021 8.2 (H) 4.8 - 5.6 % Final    Comment:             Prediabetes: 5.7 - 6.4          Diabetes: >6.4          Glycemic control for adults with diabetes: <7.0          Failed - Valid encounter within last 6 months    Recent Outpatient Visits           10 months ago Diabetes mellitus type 2 in obese [facility])   [facility] [place], [place], [place]   12 months ago Diabetes mellitus type 2 in obese [facility])   [facility] Jonah Blue B, MD   1 year ago Type 2 diabetes mellitus without complication, with long-term current use of insulin (HCC)   [facility] [place], Odette Horns, MD   2 years ago Need for vaccination against Streptococcus pneumoniae   [facility] [place], Cornelius Moras, RPH-CPP   2 years ago Type 2 diabetes mellitus without complication, with long-term current use of insulin [facility])   [facility] Marcine Matar, MD       Future Appointments             In 4 days Marcine Matar, MD [facility]

## 2022-03-25 ENCOUNTER — Encounter: Payer: Self-pay | Admitting: Internal Medicine

## 2022-03-25 ENCOUNTER — Ambulatory Visit: Payer: Self-pay | Attending: Internal Medicine | Admitting: Internal Medicine

## 2022-03-25 ENCOUNTER — Other Ambulatory Visit: Payer: Self-pay

## 2022-03-25 VITALS — BP 164/83 | HR 65 | Ht 61.0 in | Wt 157.0 lb

## 2022-03-25 DIAGNOSIS — E1159 Type 2 diabetes mellitus with other circulatory complications: Secondary | ICD-10-CM

## 2022-03-25 DIAGNOSIS — E669 Obesity, unspecified: Secondary | ICD-10-CM

## 2022-03-25 DIAGNOSIS — I152 Hypertension secondary to endocrine disorders: Secondary | ICD-10-CM

## 2022-03-25 DIAGNOSIS — E785 Hyperlipidemia, unspecified: Secondary | ICD-10-CM

## 2022-03-25 DIAGNOSIS — E1169 Type 2 diabetes mellitus with other specified complication: Secondary | ICD-10-CM

## 2022-03-25 DIAGNOSIS — B3731 Acute candidiasis of vulva and vagina: Secondary | ICD-10-CM

## 2022-03-25 LAB — POCT GLYCOSYLATED HEMOGLOBIN (HGB A1C): HbA1c, POC (controlled diabetic range): 10.3 % — AB (ref 0.0–7.0)

## 2022-03-25 LAB — GLUCOSE, POCT (MANUAL RESULT ENTRY): POC Glucose: 377 mg/dl — AB (ref 70–99)

## 2022-03-25 MED ORDER — FLUCONAZOLE 150 MG PO TABS
150.0000 mg | ORAL_TABLET | Freq: Every day | ORAL | 0 refills | Status: DC
  Filled 2022-03-25: qty 1, 1d supply, fill #0

## 2022-03-25 MED ORDER — BASAGLAR KWIKPEN 100 UNIT/ML ~~LOC~~ SOPN
44.0000 [IU] | PEN_INJECTOR | Freq: Every day | SUBCUTANEOUS | 4 refills | Status: DC
  Filled 2022-03-25 – 2022-03-28 (×2): qty 12, 27d supply, fill #0
  Filled 2022-04-24: qty 12, 27d supply, fill #1
  Filled 2022-05-24 – 2022-06-16 (×2): qty 12, 27d supply, fill #2
  Filled 2022-07-10 – 2022-07-17 (×2): qty 12, 27d supply, fill #3
  Filled 2022-08-07: qty 12, 27d supply, fill #4

## 2022-03-25 MED ORDER — LISINOPRIL 20 MG PO TABS
20.0000 mg | ORAL_TABLET | Freq: Every day | ORAL | 6 refills | Status: DC
  Filled 2022-03-25: qty 30, 30d supply, fill #0
  Filled 2022-04-19 – 2022-04-29 (×2): qty 30, 30d supply, fill #1
  Filled 2022-05-24 – 2022-06-04 (×2): qty 30, 30d supply, fill #2
  Filled 2022-07-10: qty 30, 30d supply, fill #3

## 2022-03-25 NOTE — Progress Notes (Signed)
Patient ID: Emily Clay, female    DOB: 27-Oct-1970  MRN: [id number]  CC: Chronic disease management  Subjective: Emily Clay is a 51 y.o. female who presents for chronic ds management Her concerns today include:  Patient with history of former tob dep, diabetes type 2, HTN, obesity  Currently on antibiotics for UTI.  Started having some vaginal itching.  Thinks she has vaginal yeast.  Not sure if she is having any discharge.  DM: Results for orders placed or performed in visit on 03/25/22  POCT glucose (manual entry)  Result Value Ref Range   POC Glucose 377 (A) 70 - 99 mg/dl  POCT glycosylated hemoglobin (Hb A1C)  Result Value Ref Range   Hemoglobin A1C     HbA1c POC (<> result, manual entry)     HbA1c, POC (prediabetic range)     HbA1c, POC (controlled diabetic range) 10.3 (A) 0.0 - 7.0 %  Reports compliance with taking Basaglar 40 units daily, metformin 1 g twice a day and Humalog with meals.  However she admits that several days a week she does not take the midday dose of the Humalog because she forgets to carry her pen with her to work.   Checks BS every few days in a.m.  160.  Doing okay with eating habits Does a lot of walking at work.  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  She has lost some weight since November of last year. Overdue for diabetic eye exam.  No insurance. Taking and tolerating atorvastatin 10 mg daily. In regards to her blood pressure, she reports compliance with taking lisinopril 15 mg daily.  She limits salt in the foods.  Denies any chest pains or shortness of breath.  No swelling in the legs. 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 30 tablet 6   cyclobenzaprine (FLEXERIL) 10 MG tablet Take 0.5-1 tablets (5-10 mg total) by mouth 3 (three) times daily as needed. 30 tablet 0   Insulin Glargine (BASAGLAR KWIKPEN) 100 UNIT/ML Inject 40 Units into the skin daily. 12 mL 0   insulin lispro (HUMALOG KWIKPEN) 100 UNIT/ML KwikPen inject 8 units subcutaneously three times a day with meals.  Needs to be seen prior to next refill request. 15 mL 0   lisinopril (ZESTRIL) 10 MG tablet Take 1.5 tablets (15 mg total) by mouth daily. 45 tablet 6   meloxicam (MOBIC) 15 MG tablet Take 1 tablet (15 mg total) by mouth daily. 30 tablet 0   metFORMIN (GLUCOPHAGE) 1000 MG tablet Take 1 tablet (1,000 mg total) by mouth 2 (two) times daily with a meal. (KEEP OFFICE VISIT FOR ADDITIONAL REFILLS) 60 tablet 0   sulfamethoxazole-trimethoprim (BACTRIM DS) 800-160 MG tablet Take 1 tablet by mouth 2 (two) times daily. 10 tablet 0   TRUEPLUS PEN NEEDLES 31G X 8 MM MISC USE AS DIRECTED 100 each  6   No current facility-administered medications on file prior to visit.    No Known Allergies  Social History   Socioeconomic History   Marital status: Single    Spouse name: Not on file   Number of children: 1   Years of education: Not on file   Highest education level: High school graduate  Occupational History   Not on file  Tobacco Use   Smoking status: Former    Types: Cigarettes   Smokeless tobacco: Never  Vaping Use   Vaping Use: Never used  Substance and Sexual Activity   Alcohol use: Yes    Alcohol/week: 4.0 standard drinks of alcohol    Types: 4 Cans of beer per week   Drug use: No   Sexual activity: Yes  Other Topics Concern   Not on file   Social History Narrative   Not on file   Social Determinants of Health   Financial Resource Strain: Not on file  Food Insecurity: Not on file  Transportation Needs: Not on file  Physical Activity: Not on file  Stress: Not on file  Social Connections: Not on file  Intimate Partner Violence: Not on file    Family History  Problem Relation Age of Onset   Diabetes Mother    Hypertension Mother    Cancer Mother    Diabetes Sister    Hypertension Sister    Hypertension Brother    Diabetes Maternal Aunt     Past Surgical History:  Procedure Laterality Date   CESAREAN SECTION      ROS: Review of Systems Negative except as stated above  PHYSICAL EXAM: BP (!) 164/83   Pulse 65   Ht 5\' 1"  (1.549 m)   Wt 157 lb (71.2 kg)   LMP 02/11/2022   BMI 29.66 kg/m   Wt Readings from Last 3 Encounters:  03/25/22 157 lb (71.2 kg)  05/02/21 185 lb 2 oz (84 kg)  01/27/20 190 lb 6.4 oz (86.4 kg)    Physical Exam  General appearance - alert, well appearing, middle-age African-American female and in no distress Mental status - normal mood, behavior, speech, dress, motor activity, and thought processes Neck - supple, no significant adenopathy Chest - clear to auscultation, no wheezes, rales or rhonchi, symmetric air entry Heart - normal rate, regular rhythm, normal S1, S2, no murmurs, rubs, clicks or gallops Extremities - peripheral pulses normal, no pedal edema, no clubbing or cyanosis      Latest Ref Rng & Units 10/16/2020    3:53 PM 01/27/2020    3:10 PM 07/06/2019    3:51 PM  CMP  Glucose 65 - 99 mg/dL 14/03/2019  950  932   BUN 6 - 24 mg/dL 15  17  11    Creatinine 0.57 - 1.00 mg/dL 671   2.45   Sodium 134 - 144 mmol/L 136  137  134   Potassium 3.5 - 5.2 mmol/L 4.5  4.4  4.1   Chloride 96 - 106 mmol/L 97  101  100   CO2 20 - 29 mmol/L 21  23  25    Calcium 8.7 - 10.2 mg/dL 9.5  9.6  9.3   Total Protein 6.0 - 8.5 g/dL 7.1  7.1    Total Bilirubin 0.0 - 1.2 mg/dL 0.3  0.2     Alkaline Phos 44 - 121 IU/L 90  71    AST 0 - 40 IU/L 27  21    ALT 0 - 32 IU/L 27  21     Lipid Panel     Component Value Date/Time   CHOL 141 04/04/2021 1640   TRIG 93 04/04/2021 1640   HDL 60 04/04/2021 1640   CHOLHDL 2.4 04/04/2021 1640   CHOLHDL 3.0 07/25/2013 1246   VLDL 9 07/25/2013 1246   LDLCALC 64 04/04/2021 1640    CBC    Component Value Date/Time   WBC 7.8 04/04/2021 1640   WBC 5.0 07/25/2013 1246   RBC 4.07 04/04/2021 1640   RBC 4.28 07/25/2013 1246   HGB 12.6 04/04/2021 1640   HCT 37.5 04/04/2021 1640   PLT 287 04/04/2021 1640   MCV 92 04/04/2021 1640   MCH 31.0 04/04/2021 1640   MCH 30.8 07/25/2013 1246   MCHC 33.6 04/04/2021 1640   MCHC 33.7 07/25/2013 1246   RDW 13.0 04/04/2021 1640   LYMPHSABS 1.5 07/25/2013 1246   MONOABS 0.3 07/25/2013 1246   EOSABS 0.3 07/25/2013 1246   BASOSABS 0.0 07/25/2013 1246    ASSESSMENT AND PLAN:  1. Diabetes mellitus type 2 in obese (HCC) Not at goal. Commended her on weight loss but I think part of that may be due to uncontrolled diabetes.  Discussed and encourage healthy eating habits.  Continue to stay active. -Increase Basaglar to 44 units daily. Advised her to keep a Humalog pen in her work bag so that she remembers to give herself her lunchtime insulin when at work.  Continue current dose of metformin. Check blood sugars twice a day before meals.  Follow-up with clinical pharmacist in 2 weeks with blood sugar readings. - POCT glucose (manual entry) - POCT glycosylated hemoglobin (Hb A1C) - Insulin Glargine (BASAGLAR KWIKPEN) 100 UNIT/ML; Inject 44 Units into the skin daily.  Dispense: 12 mL; Refill: 4 - CBC; Future - Comprehensive metabolic panel; Future - Microalbumin / creatinine urine ratio; Future  2. Hypertension associated with diabetes (HCC) Not at goal.  Increase lisinopril to 20 mg daily.  Encouraged to check blood pressure at least once a week with goal being 130/80 or lower. - lisinopril  (ZESTRIL) 20 MG tablet; Take 1 tablet (20 mg total) by mouth daily.  Dispense: 30 tablet; Refill: 6  3. Hyperlipidemia associated with type 2 diabetes mellitus (HCC) Continue atorvastatin. - Lipid panel; Future  4. Vaginal candidiasis - fluconazole (DIFLUCAN) 150 MG tablet; Take 1 tablet (150 mg total) by mouth daily.  Dispense: 1 tablet; Refill: 0    Patient was given the opportunity to ask questions.  Patient verbalized understanding of the plan and was able to repeat key elements of the plan.   This documentation was completed using Paediatric nurse.  Any transcriptional errors are unintentional.  Orders Placed This Encounter  Procedures   POCT glucose (manual entry)   POCT glycosylated hemoglobin (Hb A1C)     Requested Prescriptions    No prescriptions requested or ordered in this encounter    No follow-ups on file.  Emily Blue, MD, FACP

## 2022-03-28 ENCOUNTER — Other Ambulatory Visit: Payer: Self-pay

## 2022-03-28 ENCOUNTER — Other Ambulatory Visit: Payer: Self-pay | Admitting: Internal Medicine

## 2022-03-28 DIAGNOSIS — E1169 Type 2 diabetes mellitus with other specified complication: Secondary | ICD-10-CM

## 2022-03-28 MED ORDER — INSULIN LISPRO (1 UNIT DIAL) 100 UNIT/ML (KWIKPEN)
PEN_INJECTOR | SUBCUTANEOUS | 1 refills | Status: DC
  Filled 2022-03-28: qty 6, 25d supply, fill #0
  Filled 2022-04-24: qty 6, 25d supply, fill #1
  Filled 2022-05-20: qty 6, 25d supply, fill #2
  Filled 2022-06-16: qty 6, 25d supply, fill #3
  Filled 2022-07-10 – 2022-07-17 (×2): qty 6, 25d supply, fill #4

## 2022-04-01 ENCOUNTER — Other Ambulatory Visit: Payer: Self-pay

## 2022-04-01 NOTE — Telephone Encounter (Signed)
Requested medication (s) are due for refill today: Yes  Requested medication (s) are on the active medication list: Yes  Last refill:  02/22/22  Future visit scheduled: Yes  Notes to clinic:  Protocol indicates pt. Needs lab work.    Requested Prescriptions  Pending Prescriptions Disp Refills   metFORMIN (GLUCOPHAGE) 1000 MG tablet 60 tablet 0    Sig: Take 1 tablet (1,000 mg total) by mouth 2 (two) times daily with a meal. (KEEP OFFICE VISIT FOR ADDITIONAL REFILLS)     Endocrinology:  Diabetes - Biguanides Failed - 03/28/2022  8:47 PM      Failed - Cr in normal range and within 360 days    Creat  Date Value Ref Range Status  07/25/2013 0.70 0.50 - 1.10 mg/dL Final   Creatinine, Ser  Date Value Ref Range Status  10/16/2020 0.88 0.57 - 1.00 mg/dL Final         Failed - HBA1C is between 0 and 7.9 and within 180 days    HbA1c, POC (controlled diabetic range)  Date Value Ref Range Status  03/25/2022 10.3 (A) 0.0 - 7.0 % Final         Failed - eGFR in normal range and within 360 days    GFR, Est African American  Date Value Ref Range Status  07/25/2013 >89 mL/min Final   GFR calc Af Amer  Date Value Ref Range Status  01/27/2020 92 >59 mL/min/1.73 Final    Comment:    **[facility] currently reports eGFR in compliance with the current**   recommendations of the [employer]. [facility] will   update reporting as new guidelines are published from the NKF-ASN   Task force.    GFR, Est Non African American  Date Value Ref Range Status  07/25/2013 >89 mL/min Final    Comment:      The estimated GFR is a calculation valid for adults (>=30 years old) that uses the CKD-EPI algorithm to adjust for age and sex. It is   not to be used for children, pregnant women, hospitalized patients,    patients on dialysis, or with rapidly changing kidney function. According to the NKDEP, eGFR >89 is normal, 60-89 shows mild impairment, 30-59 shows moderate impairment, 15-29 shows  severe impairment and <15 is ESRD.     GFR calc non Af Amer  Date Value Ref Range Status  01/27/2020 80 >59 mL/min/1.73 Final   eGFR  Date Value Ref Range Status  10/16/2020 82 >59 mL/min/1.73 Final         Failed - B12 Level in normal range and within 720 days    No results found for: "VITAMINB12"       Failed - CBC within normal limits and completed in the last 12 months    WBC  Date Value Ref Range Status  04/04/2021 7.8 3.4 - 10.8 x10E3/uL Final  07/25/2013 5.0 4.0 - 10.5 K/uL Final   RBC  Date Value Ref Range Status  04/04/2021 4.07 3.77 - 5.28 x10E6/uL Final  07/25/2013 4.28 3.87 - 5.11 MIL/uL Final   Hemoglobin  Date Value Ref Range Status  04/04/2021 12.6 11.1 - 15.9 g/dL Final   Hematocrit  Date Value Ref Range Status  04/04/2021 37.5 34.0 - 46.6 % Final   MCHC  Date Value Ref Range Status  04/04/2021 33.6 31.5 - 35.7 g/dL Final  07/25/2013 33.7 30.0 - 36.0 g/dL Final   [facility]  Date Value Ref Range Status  04/04/2021 31.0 26.6 - 33.0  pg Final  07/25/2013 30.8 26.0 - 34.0 pg Final   MCV  Date Value Ref Range Status  04/04/2021 92 79 - 97 fL Final   No results found for: "PLTCOUNTKUC", "LABPLAT", "POCPLA" RDW  Date Value Ref Range Status  04/04/2021 13.0 11.7 - 15.4 % Final         Passed - Valid encounter within last 6 months    Recent Outpatient Visits           1 week ago Diabetes mellitus type 2 in obese [facility])   [place] Karle Plumber B, MD   11 months ago Diabetes mellitus type 2 in obese [facility])   [place], [place], [place]   1 year ago Diabetes mellitus type 2 in obese [facility])   [place], Deborah B, MD   1 year ago Type 2 diabetes mellitus without complication, with long-term current use of insulin ([place])   [place], [place], MD   2 years ago Need for vaccination against Streptococcus  pneumoniae   [place], RPH-CPP       Future Appointments             In 2 weeks Daisy Blossom, Jarome Matin, [place]   In 3 months Wynetta Emery, Dalbert Batman, [place]

## 2022-04-02 ENCOUNTER — Other Ambulatory Visit: Payer: Self-pay

## 2022-04-02 MED ORDER — METFORMIN HCL 1000 MG PO TABS
1000.0000 mg | ORAL_TABLET | Freq: Two times a day (BID) | ORAL | 6 refills | Status: DC
  Filled 2022-04-02 (×2): qty 60, 30d supply, fill #0
  Filled 2022-04-29 (×2): qty 60, 30d supply, fill #1
  Filled 2022-05-29 – 2022-06-04 (×3): qty 60, 30d supply, fill #2
  Filled 2022-07-10 – 2022-07-17 (×2): qty 60, 30d supply, fill #3
  Filled 2022-08-11: qty 60, 30d supply, fill #4
  Filled 2022-09-10: qty 60, 30d supply, fill #5

## 2022-04-08 ENCOUNTER — Other Ambulatory Visit: Payer: Self-pay

## 2022-04-18 ENCOUNTER — Other Ambulatory Visit: Payer: Self-pay

## 2022-04-18 ENCOUNTER — Ambulatory Visit: Payer: Self-pay | Attending: Nurse Practitioner | Admitting: Pharmacist

## 2022-04-18 ENCOUNTER — Encounter: Payer: Self-pay | Admitting: Pharmacist

## 2022-04-18 DIAGNOSIS — Z794 Long term (current) use of insulin: Secondary | ICD-10-CM | POA: Insufficient documentation

## 2022-04-18 DIAGNOSIS — E1169 Type 2 diabetes mellitus with other specified complication: Secondary | ICD-10-CM | POA: Insufficient documentation

## 2022-04-18 DIAGNOSIS — E669 Obesity, unspecified: Secondary | ICD-10-CM | POA: Insufficient documentation

## 2022-04-18 MED ORDER — TRUE METRIX BLOOD GLUCOSE TEST VI STRP
1.0000 | ORAL_STRIP | Freq: Three times a day (TID) | 2 refills | Status: DC
  Filled 2022-04-18: qty 100, 34d supply, fill #0
  Filled 2022-05-15: qty 100, 34d supply, fill #1
  Filled 2022-06-20: qty 100, 34d supply, fill #2

## 2022-04-18 MED ORDER — TRUE METRIX METER W/DEVICE KIT
PACK | 0 refills | Status: DC
  Filled 2022-04-18: qty 1, 30d supply, fill #0

## 2022-04-18 MED ORDER — TRUEPLUS LANCETS 28G MISC
1.0000 | Freq: Three times a day (TID) | 2 refills | Status: DC
  Filled 2022-04-18: qty 100, 34d supply, fill #0
  Filled 2022-05-15: qty 100, 34d supply, fill #1
  Filled 2022-06-20: qty 100, 34d supply, fill #2

## 2022-04-18 NOTE — Progress Notes (Signed)
S:     No chief complaint on file.  Emily Clay is a 51 y.o. female who presents for diabetes evaluation, education, and management.  PMH is significant for HTN, T2DM w/ nephropathy, HLD.  Patient was referred and last seen by Primary Care Provider, Dr. Wynetta Emery, on 03/25/2022.   At last visit, A1c was 10.3 (up from 8.2). Of note, pt admitted to missing Humalog doses during the day. Her dose of Basaglar was increased and she was advised to keep the Humalog pen in use with her at work to allow for better adherence.  Today, patient arrives in good spirits and presents without any assistance. She has done better with adherence overall since her last PCP visit. Denies any missed doses of Basaglar or Humalog, however, is not checking blood sugar levels at home.   Family/Social History:  -Fhx: DM, HTN -Tobacco: former smoker -Alcohol: none reported   Current diabetes medications include: Basaglar 44u daily, Humaog 8u TID before meals, metformin 1000 mg BID  Patient reports adherence to taking all medications as prescribed.   Insurance coverage: no rx drug coverage.   Patient denies hypoglycemic events.  Reported home fasting blood sugars: not checking   Reported 2 hour post-meal/random blood sugars: not checking  Patient denies nocturia (nighttime urination).  Patient denies neuropathy (nerve pain). Patient reports visual changes. Patient denies self foot exams.   Patient reported dietary habits:  -Tries to adhere to a diabetic diet  Patient-reported exercise habits:  -Walks a lot at work, but no formal exercise regimen outside of work.    O:   ROS  Physical Exam  7 day average blood glucose: no GM with her today.   No CGM in place.   Lab Results  Component Value Date   HGBA1C 10.3 (A) 03/25/2022   There were no vitals filed for this visit.  Lipid Panel     Component Value Date/Time   CHOL 141 04/04/2021 1640   TRIG 93 04/04/2021 1640   HDL 60 04/04/2021  1640   CHOLHDL 2.4 04/04/2021 1640   CHOLHDL 3.0 07/25/2013 1246   VLDL 9 07/25/2013 1246   LDLCALC 64 04/04/2021 1640    Clinical Atherosclerotic Cardiovascular Disease (ASCVD): No  The 10-year ASCVD risk score (Arnett DK, et al., 2019) is: 12.5%   Values used to calculate the score:     Age: 43 years     Sex: Female     Is Non-Hispanic African American: Yes     Diabetic: Yes     Tobacco smoker: No     Systolic Blood Pressure: 782 mmHg     Is BP treated: Yes     HDL Cholesterol: 60 mg/dL     Total Cholesterol: 141 mg/dL   A/P: Diabetes longstanding currently uncontrolled based on A1c. Level of control at home unknown. Encouraged patient to pick-up True Metrix supplies from our pharmacy and begin taking at home. Patient is able to verbalize appropriate hypoglycemia management plan. Medication adherence appears appropriate. -Continued current regimen. It is hard for [place] to know what to adjust without home readings.  -True Metrix supplies sent.   -Extensively discussed pathophysiology of diabetes, recommended lifestyle interventions, dietary effects on blood sugar control.  -Counseled on s/sx of and management of hypoglycemia.  -Next A1c anticipated 05/2022.   Written patient instructions provided. Patient verbalized understanding of treatment plan.  Total time in face to face counseling 30 minutes.    Follow-up:  Pharmacist in 1 month.  Lurena Joiner  Daisy Blossom, PharmD, [place], [place] [phone]

## 2022-04-21 ENCOUNTER — Other Ambulatory Visit: Payer: Self-pay

## 2022-04-25 ENCOUNTER — Other Ambulatory Visit: Payer: Self-pay

## 2022-04-28 ENCOUNTER — Other Ambulatory Visit: Payer: Self-pay

## 2022-04-29 ENCOUNTER — Other Ambulatory Visit: Payer: Self-pay

## 2022-04-29 ENCOUNTER — Telehealth: Payer: Self-pay | Admitting: Internal Medicine

## 2022-04-29 NOTE — Telephone Encounter (Signed)
Pt lives in [place] at the moment and has appt 10/26.  However, husband has appt at another office at 3:00 on 10/23.  Pt would like to know if you can work her in around 3 pm that same day (oct 23) due to not having to drive from [place] 2 times same week.  Please call pt at [phone] or OK to contact through mychart.

## 2022-05-05 ENCOUNTER — Other Ambulatory Visit: Payer: Self-pay

## 2022-05-12 ENCOUNTER — Other Ambulatory Visit: Payer: Self-pay

## 2022-05-15 ENCOUNTER — Other Ambulatory Visit: Payer: Self-pay

## 2022-05-20 ENCOUNTER — Other Ambulatory Visit: Payer: Self-pay

## 2022-05-21 ENCOUNTER — Other Ambulatory Visit: Payer: Self-pay

## 2022-05-21 NOTE — Progress Notes (Deleted)
S:     PCP: Jonah Blue  51 y.o. female who presents for diabetes evaluation, education, and management. PMH is significant for  HTN, T2DM w/ nephropathy, HLD.  Patient was referred and last seen by Primary Care Provider, Dr. Laural Benes, on 03/25/2022.   At last visit, with PCP, A1c was 10.3 (up from 8.2). Of note, pt admitted to missing Humalog doses during the day. Her dose of Basaglar was increased and she was advised to keep the Humalog pen in use with her at work to allow for better adherence.  At last visit with clinical pharmacist, no home readings were provided so current regimen was continued. Encouraged to follow up with BG readings.   Today, patient arrives in *** good spirits and presents without *** any assistance. ***   Family/Social History:  -Fhx: DM, HTN -Tobacco: former smoker -Alcohol: none reported    Current diabetes medications include: Basaglar 44u daily, Humalog 8u TID before meals, metformin 1000 mg BID Current antihypertensive medications:lisinopril 20mg  once daily Current antihyperlipidemic medications:atorvastatin mg once daily   Patient reports adherence to taking all medications as prescribed.  *** Patient denies adherence with medications, reports missing *** medications *** times per week, on average.  Insurance coverage: none  Patient {Actions; denies-reports:120008} hypoglycemic events.  Reported home fasting blood sugars: ***  Reported 2 hour post-meal/random blood sugars: ***.  Patient {Actions; denies-reports:120008} nocturia (nighttime urination).  Patient {Actions; denies-reports:120008} neuropathy (nerve pain). Patient {Actions; denies-reports:120008} visual changes. Patient {Actions; denies-reports:120008} self foot exams.   Patient reported dietary habits:  -Tries to adhere to a diabetic diet   Patient-reported exercise habits:  -Walks a lot at work, but no formal exercise regimen outside of work.    O:    Lab Results   Component Value Date   HGBA1C 10.3 (A) 03/25/2022   There were no vitals filed for this visit.  Lipid Panel     Component Value Date/Time   CHOL 141 04/04/2021 1640   TRIG 93 04/04/2021 1640   HDL 60 04/04/2021 1640   CHOLHDL 2.4 04/04/2021 1640   CHOLHDL 3.0 07/25/2013 1246   VLDL 9 07/25/2013 1246   LDLCALC 64 04/04/2021 1640    Clinical Atherosclerotic Cardiovascular Disease (ASCVD): No  The 10-year ASCVD risk score (Arnett DK, et al., 2019) is: 12.5%   Values used to calculate the score:     Age: 51 years     Sex: Female     Is Non-Hispanic African American: Yes     Diabetic: Yes     Tobacco smoker: No     Systolic Blood Pressure: 164 mmHg     Is BP treated: Yes     HDL Cholesterol: 60 mg/dL     Total Cholesterol: 141 mg/dL   A/P: Diabetes longstanding *** currently ***. Patient is *** able to verbalize appropriate hypoglycemia management plan. Medication adherence appears ***. Control is suboptimal due to ***. -{Meds adjust:18428} basal insulin *** (insulin ***). Patient will continue to titrate 1 unit every *** days if fasting blood sugar > 100mg /dl until fasting blood sugars reach goal or next visit.  -{Meds adjust:18428} rapid insulin *** (insulin ***) to ***.  -{Meds adjust:18428} GLP-1 *** (generic ***) to ***.  -{Meds adjust:18428} SGLT2-I *** (generic ***) to ***. Counseled on sick day rules. -{Meds adjust:18428} metformin *** to ***.  -Patient educated on purpose, proper use, and potential adverse effects of ***.  -Extensively discussed pathophysiology of diabetes, recommended lifestyle interventions, dietary effects on blood sugar control.  -  Counseled on s/sx of and management of hypoglycemia.  -Next A1c anticipated ***.   ASCVD risk - primary ***secondary prevention in patient with diabetes. Last LDL is *** not at goal of <70 *** mg/dL. ASCVD risk factors include *** and 10-year ASCVD risk score of ***. {Desc; low/moderate/high:110033} intensity statin  indicated.  -{Meds adjust:18428} ***statin *** mg.   Hypertension longstanding *** currently ***. Blood pressure goal of <130/80 *** mmHg. Medication adherence ***. Blood pressure control is suboptimal due to ***. -***  Written patient instructions provided. Patient verbalized understanding of treatment plan.  Total time in face to face counseling *** minutes.    Follow-up:  Pharmacist ***. PCP clinic visit in January  Maryan Puls, PharmD PGY-1 [facility] Resident

## 2022-05-22 ENCOUNTER — Ambulatory Visit: Payer: Medicaid Other | Admitting: Pharmacist

## 2022-05-22 ENCOUNTER — Other Ambulatory Visit: Payer: Self-pay

## 2022-05-26 ENCOUNTER — Other Ambulatory Visit: Payer: Self-pay

## 2022-05-29 ENCOUNTER — Other Ambulatory Visit: Payer: Self-pay

## 2022-06-04 ENCOUNTER — Other Ambulatory Visit: Payer: Self-pay

## 2022-06-05 ENCOUNTER — Other Ambulatory Visit: Payer: Self-pay

## 2022-06-16 ENCOUNTER — Other Ambulatory Visit: Payer: Self-pay

## 2022-06-20 ENCOUNTER — Other Ambulatory Visit: Payer: Self-pay

## 2022-07-10 ENCOUNTER — Other Ambulatory Visit: Payer: Self-pay

## 2022-07-10 ENCOUNTER — Ambulatory Visit (HOSPITAL_COMMUNITY)
Admission: EM | Admit: 2022-07-10 | Discharge: 2022-07-10 | Disposition: A | Discharge status: Home / Self Care | Payer: Medicaid Other | Attending: Internal Medicine | Admitting: Internal Medicine

## 2022-07-10 ENCOUNTER — Encounter (HOSPITAL_COMMUNITY): Payer: Self-pay | Admitting: *Deleted

## 2022-07-10 DIAGNOSIS — I10 Essential (primary) hypertension: Secondary | ICD-10-CM

## 2022-07-10 DIAGNOSIS — H60391 Other infective otitis externa, right ear: Secondary | ICD-10-CM

## 2022-07-10 DIAGNOSIS — H6692 Otitis media, unspecified, left ear: Secondary | ICD-10-CM

## 2022-07-10 DIAGNOSIS — H9203 Otalgia, bilateral: Secondary | ICD-10-CM

## 2022-07-10 DIAGNOSIS — W108XXA Fall (on) (from) other stairs and steps, initial encounter: Secondary | ICD-10-CM

## 2022-07-10 MED ORDER — OFLOXACIN 0.3 % OT SOLN: 10.0000 [drp] | Freq: Every day | OTIC | 0 refills | Status: DC

## 2022-07-10 MED ORDER — BLOOD PRESSURE CUFF MISC: 0 refills | Status: AC

## 2022-07-10 MED ORDER — LISINOPRIL 30 MG PO TABS: 30.0000 mg | ORAL_TABLET | Freq: Every day | ORAL | 1 refills | Status: DC

## 2022-07-10 MED ORDER — AMOXICILLIN 875 MG PO TABS: 875.0000 mg | ORAL_TABLET | Freq: Two times a day (BID) | ORAL | 0 refills | Status: AC

## 2022-07-10 NOTE — ED Triage Notes (Addendum)
Pts grandson is helping with communication due to hearing loss.   Pt states that three days ago she developed hearing loss and ear pain. She has been using OTC ear drops. She said its a noise in her ears all the time and its echoing.   She states that her hearing issue started when she fell down some wooden steps and hit her head on Monday

## 2022-07-10 NOTE — Discharge Instructions (Addendum)
Your ear pain is likely due to ear infections and may not be related to your recent fall.  Apply 10 drops of ofloxacin to the right ear once a day for the next 7 days to treat outer infection to the right ear.   Take amoxicillin 2 times a day for the next 7 days to treat left sided ear infection.   You may continue taking 1,000mg  of tylenol as needed for pain to your ears. Do not place anything into the ears including Q-tips.   If your hearing does not get better after using the antibiotics, I would like for you to schedule an appointment with the audiologist (hearing doctor) for further evaluation.   Increase your lisinopril to 30mg  per day. Your ear ringing is likely because of your elevated blood pressure.  Take your blood pressure 1-2 times daily (morning and night) and write these numbers down in a log. A blood pressure cuff has been sent to the pharmacy for you. Schedule an appointment with your primary care provider so that you are able to follow-up on your ears and your blood pressure medication change.  Bring your list of numbers to the primary care appointment.  If you develop any new or worsening symptoms or do not improve in the next 2 to 3 days, please return.  If your symptoms are severe, please go to the emergency room.  Follow-up with your primary care provider for further evaluation and management of your symptoms as well as ongoing wellness visits.  I hope you feel better!

## 2022-07-10 NOTE — ED Provider Notes (Addendum)
[place]    CSN: [id number] Arrival date & time: 07/10/22  1541      History   Chief Complaint Chief Complaint  Patient presents with   Hearing Problem   Otalgia    HPI Emily Clay is a 51 y.o. female.   Patient presents to urgent care for evaluation of decreased hearing, tinnitus, and ear pain that started to the left ear in the morning and the right ear in the afternoon afterwards on July 08, 2022 (2 days ago). Describes tinnitus as a "whooshing" sound to the bilateral ears. The day before, she states she slipped and accidentally fell down 4 wooden steps due to rain water landing on her buttocks, right shoulder, left elbow, and she believes she also may have hit her head after landing on her buttocks/back. She is not having any pain to the back, shoulder, elbow, or neck. Developed slight headache this morning, took  tylenol and headache fully resolved. She denies history of head trauma in the past. She denies preceding dizziness, nausea, vomiting, chest pain, shortness of breath, and lightheadedness prior to falling. She is not on blood thinners. She did not become nauseous, vomit, lose bowel/bladder continence, or have a headache after falling.    Otalgia   Past Medical History:  Diagnosis Date   Diabetes mellitus    Hypertension    Proteinuria     Patient Active Problem List   Diagnosis Date Noted   Hyperlipidemia associated with type 2 diabetes mellitus ([place]) 01/28/2020   Former smoker 01/27/2020   Essential hypertension 01/27/2020   Diabetes mellitus with macroalbuminuric diabetic nephropathy ([place]) 07/25/2013   Muscle strain 07/25/2013    Past Surgical History:  Procedure Laterality Date   CESAREAN SECTION      OB History   No obstetric history on file.      Home Medications    Prior to Admission medications   Medication Sig Start Date End Date Taking? Authorizing Provider  amoxicillin (AMOXIL) 875 MG tablet Take 1 tablet (875 mg  total) by mouth 2 (two) times daily for 7 days. 07/10/22 07/17/22 Yes Talbot Grumbling, FNP  atorvastatin (LIPITOR) 10 MG tablet Take 1 tablet (10 mg total) by mouth once daily. 12/12/21  Yes Ladell Pier, MD  Blood Glucose Monitoring Suppl (TRUE METRIX METER) w/Device KIT Use to check blood sugar 3 times daily. 04/18/22  Yes Ladell Pier, MD  Blood Pressure Monitoring (BLOOD PRESSURE CUFF) MISC Take BP 1-2 times daily, write numbers down in a book and bring to your primary care doctor appointment. 07/10/22  Yes Talbot Grumbling, FNP  glucose blood (TRUE METRIX BLOOD GLUCOSE TEST) test strip Use 1 strip to check blood sugar 3 times daily . 04/18/22  Yes Ladell Pier, MD  Insulin Glargine [facility] KWIKPEN) 100 UNIT/ML Inject 44 Units into the skin daily. 03/25/22  Yes Ladell Pier, MD  insulin lispro (HUMALOG KWIKPEN) 100 UNIT/ML KwikPen inject 8 units subcutaneously three times a day with meals. 03/28/22  Yes Ladell Pier, MD  lisinopril (ZESTRIL) 30 MG tablet Take 1 tablet (30 mg total) by mouth daily. 07/10/22  Yes Talbot Grumbling, FNP  metFORMIN (GLUCOPHAGE) 1000 MG tablet Take 1 tablet (1,000 mg total) by mouth 2 (two) times daily with a meal. 04/02/22  Yes Ladell Pier, MD  ofloxacin (FLOXIN) 0.3 % OTIC solution Place 10 drops into the right ear daily. 07/10/22  Yes Talbot Grumbling, FNP  TRUEplus Lancets 28G MISC  Use 1 lancet to check blood sugar 3 times daily. 04/18/22  Yes Ladell Pier, MD  TRUEPLUS PEN NEEDLES 31G X 8 MM MISC USE AS DIRECTED 09/14/20  Yes Ladell Pier, MD  cyclobenzaprine (FLEXERIL) 10 MG tablet Take 0.5-1 tablets (5-10 mg total) by mouth 3 (three) times daily as needed. 03/17/22   Mar Daring, PA-C  fluconazole (DIFLUCAN) 150 MG tablet Take 1 tablet (150 mg total) by mouth daily. 03/25/22   Ladell Pier, MD  meloxicam (MOBIC) 15 MG tablet Take 1 tablet (15 mg total) by mouth daily. 03/17/22   Mar Daring, PA-C  sulfamethoxazole-trimethoprim (BACTRIM DS) 800-160 MG tablet Take 1 tablet by mouth 2 (two) times daily. 03/17/22   Mar Daring, PA-C    Family History Family History  Problem Relation Age of Onset   Diabetes Mother    Hypertension Mother    Cancer Mother    Diabetes Sister    Hypertension Sister    Hypertension Brother    Diabetes Maternal Aunt     Social History Social History   Tobacco Use   Smoking status: Former    Types: Cigarettes   Smokeless tobacco: Never  Vaping Use   Vaping Use: Never used  Substance Use Topics   Alcohol use: Yes    Alcohol/week: 4.0 standard drinks of alcohol    Types: 4 Cans of beer per week   Drug use: No     Allergies   Patient has no known allergies.   Review of Systems Review of Systems  HENT:  Positive for ear pain.   Per HPI   Physical Exam Triage Vital Signs ED Triage Vitals [07/10/22 1705]  Enc Vitals Group     BP (!) 202/90     Pulse Rate 76     Resp 18     Temp 99.1 F (37.3 C)     Temp src      SpO2 98 %     Weight      Height      Head Circumference      Peak Flow      Pain Score      Pain Loc      Pain Edu?      Excl. in [place]?    No data found.  Updated Vital Signs BP (!) 202/90 (BP Location: Right Arm)   Pulse 76   Temp 99.1 F (37.3 C)   Resp 18   LMP  (LMP Unknown) Comment: has been months  SpO2 98%   Visual Acuity Right Eye Distance:   Left Eye Distance:   Bilateral Distance:    Right Eye Near:   Left Eye Near:    Bilateral Near:     Physical Exam Vitals and nursing note reviewed.  Constitutional:      Appearance: She is not ill-appearing or toxic-appearing.  HENT:     Head: Normocephalic and atraumatic.     Right Ear: Ear canal and external ear normal. Decreased hearing noted. Drainage, swelling and tenderness present.     Left Ear: Ear canal and external ear normal. Decreased hearing noted. Tenderness present. Tympanic membrane is erythematous and bulging.      Ears:     Comments: Thick, purulent, and crusty drainage to the right ear canal with swelling of the ear canal making it difficult to visualize the right TM. Left TM appears bulging and erythematous without drainage. Whisper test positive (failed this) bilaterally.  Nose: Nose normal.     Mouth/Throat:     Lips: Pink.  Eyes:     General: Lids are normal. Vision grossly intact. Gaze aligned appropriately.     Extraocular Movements: Extraocular movements intact.     Conjunctiva/sclera: Conjunctivae normal.  Cardiovascular:     Rate and Rhythm: Normal rate and regular rhythm.     Heart sounds: Normal heart sounds, S1 normal and S2 normal.  Pulmonary:     Effort: Pulmonary effort is normal. No respiratory distress.     Breath sounds: Normal breath sounds and air entry.  Musculoskeletal:     Cervical back: Neck supple.  Skin:    General: Skin is warm and dry.     Capillary Refill: Capillary refill takes less than 2 seconds.     Findings: No rash.  Neurological:     General: No focal deficit present.     Mental Status: She is alert and oriented to person, place, and time. Mental status is at baseline.     Cranial Nerves: No dysarthria or facial asymmetry.  Psychiatric:        Mood and Affect: Mood normal.        Speech: Speech normal.        Behavior: Behavior normal.        Thought Content: Thought content normal.        Judgment: Judgment normal.      UC Treatments / Results  Labs (all labs ordered are listed, but only abnormal results are displayed) Labs Reviewed - No data to display  EKG   Radiology No results found.  Procedures Procedures (including critical care time)  Medications Ordered in UC Medications - No data to display  Initial Impression / Assessment and Plan / UC Course  I have reviewed the triage vital signs and the nursing notes.  Pertinent labs & imaging results that were available during my care of the patient were reviewed by me and  considered in my medical decision making (see chart for details).   1. Otalgia of both ears Infective otitis externa of right ear to be treated with ofloxacin ear drops 10 drops to the right ear once daily for the next 7 days. Otitis media of the left ear will be treated with amoxicillin twice daily for 7 days with food. I suspect patient's recent fall is separate from ear complaints as ear symptoms started the day after falling, not immediately. She is neurovascularly intact to baseline and without focal deficit to neurologic examination. Tinnitus is most likely pulsating tinnitus related to elevated blood pressure.   2. Essential hypertension BP significantly elevated at 202/90, up from 160s/80s at previous visits. No red flag signs indicating organ damage related to elevated blood pressure, pain is likely contributing as well to elevation. Currently taking lisinopril 58m once daily, would like to increase this to lisinopril 37monce daily for better blood pressure management. She has a PCP, discussed follow-up recommendations with PCP for ongoing BP management. She is to take BP with home cuff sent to pharmacy 5-7 times weekly while starting new dose of medication for 2 weeks, then take BP at home 2-3 times weekly and write these numbers down in a log to bring to PCP appointment. DASH diet education provided.   Discussed physical exam and available lab work findings in clinic with patient.  Counseled patient regarding appropriate use of medications and potential side effects for all medications recommended or prescribed today. Discussed red flag signs and symptoms  of worsening condition,when to call the PCP office, return to urgent care, and when to seek higher level of care in the emergency department. Patient verbalizes understanding and agreement with plan. All questions answered. Patient discharged in stable condition.    Final Clinical Impressions(s) / UC Diagnoses   Final diagnoses:   Otalgia of both ears  Infective otitis externa of right ear  Left otitis media, unspecified otitis media type  Fall (on) (from) other stairs and steps, initial encounter  Essential hypertension     Discharge Instructions      Your ear pain is likely due to ear infections and may not be related to your recent fall.  Apply 10 drops of ofloxacin to the right ear once a day for the next 7 days to treat outer infection to the right ear.   Take amoxicillin 2 times a day for the next 7 days to treat left sided ear infection.   You may continue taking 1,071m of tylenol as needed for pain to your ears. Do not place anything into the ears including Q-tips.   If your hearing does not get better after using the antibiotics, I would like for you to schedule an appointment with the audiologist (hearing doctor) for further evaluation.   Increase your lisinopril to 386mper day. Your ear ringing is likely because of your elevated blood pressure.  Take your blood pressure 1-2 times daily (morning and night) and write these numbers down in a log. A blood pressure cuff has been sent to the pharmacy for you. Schedule an appointment with your primary care provider so that you are able to follow-up on your ears and your blood pressure medication change.  Bring your list of numbers to the primary care appointment.  If you develop any new or worsening symptoms or do not improve in the next 2 to 3 days, please return.  If your symptoms are severe, please go to the emergency room.  Follow-up with your primary care provider for further evaluation and management of your symptoms as well as ongoing wellness visits.  I hope you feel better!     ED Prescriptions     Medication Sig Dispense Auth. Provider   lisinopril (ZESTRIL) 30 MG tablet Take 1 tablet (30 mg total) by mouth daily. 30 tablet StJoella Prince, FNP   amoxicillin (AMOXIL) 875 MG tablet Take 1 tablet (875 mg total) by mouth 2 (two) times  daily for 7 days. 14 tablet StJoella Prince, FNNorth [place] Blood Pressure Monitoring (BLOOD PRESSURE CUFF) MISC Take BP 1-2 times daily, write numbers down in a book and bring to your primary care doctor appointment. 1 each StTalbot GrumblingFNP   ofloxacin (FLOXIN) 0.3 % OTIC solution Place 10 drops into the right ear daily. 5 mL StTalbot GrumblingFNP      PDMP not reviewed this encounter.   StTalbot GrumblingFN[place]ford2/17/23 19[place]ortlandCa[place]ocoFNP 07/27/22 2012

## 2022-07-11 ENCOUNTER — Other Ambulatory Visit: Payer: Self-pay

## 2022-07-17 ENCOUNTER — Other Ambulatory Visit: Payer: Self-pay

## 2022-07-18 ENCOUNTER — Telehealth: Payer: Self-pay | Admitting: Physician Assistant

## 2022-07-18 ENCOUNTER — Other Ambulatory Visit: Payer: Self-pay

## 2022-07-18 DIAGNOSIS — B379 Candidiasis, unspecified: Secondary | ICD-10-CM

## 2022-07-18 DIAGNOSIS — T3695XA Adverse effect of unspecified systemic antibiotic, initial encounter: Secondary | ICD-10-CM

## 2022-07-18 MED ORDER — FLUCONAZOLE 150 MG PO TABS: 150.0000 mg | ORAL_TABLET | ORAL | 0 refills | Status: DC | PRN

## 2022-07-18 NOTE — Progress Notes (Signed)
E-Visit for Vaginal Symptoms  We are sorry that you are not feeling well. Here is how we plan to help! Based on what you shared with me it looks like you: May have a yeast vaginosis  Vaginosis is an inflammation of the vagina that can result in discharge, itching and pain. The cause is usually a change in the normal balance of vaginal bacteria or an infection. Vaginosis can also result from reduced estrogen levels after menopause.  The most common causes of vaginosis are:   Bacterial vaginosis which results from an overgrowth of one on several organisms that are normally present in your vagina.   Yeast infections which are caused by a naturally occurring fungus called candida.   Vaginal atrophy (atrophic vaginosis) which results from the thinning of the vagina from reduced estrogen levels after menopause.   Trichomoniasis which is caused by a parasite and is commonly transmitted by sexual intercourse.  Factors that increase your risk of developing vaginosis include: Medications, such as antibiotics and steroids Uncontrolled diabetes Use of hygiene products such as bubble bath, vaginal spray or vaginal deodorant Douching Wearing damp or tight-fitting clothing Using an intrauterine device (IUD) for birth control Hormonal changes, such as those associated with pregnancy, birth control pills or menopause Sexual activity Having a sexually transmitted infection  Your treatment plan is A single Diflucan (fluconazole) 150mg  tablet once.  I have electronically sent this prescription into the pharmacy that you have chosen. With one extra tablet to take in 3 days if needed  Be sure to take all of the medication as directed. Stop taking any medication if you develop a rash, tongue swelling or shortness of breath. Mothers who are breast feeding should consider pumping and discarding their breast milk while on these antibiotics. However, there is no consensus that infant exposure at these doses would  be harmful.  Remember that medication creams can weaken latex condoms.   HOME CARE:  Good hygiene may prevent some types of vaginosis from recurring and may relieve some symptoms:  Avoid baths, hot tubs and whirlpool spas. Rinse soap from your outer genital area after a shower, and dry the area well to prevent irritation. Don't use scented or harsh soaps, such as those with deodorant or antibacterial action. Avoid irritants. These include scented tampons and pads. Wipe from front to back after using the toilet. Doing so avoids spreading fecal bacteria to your vagina.  Other things that may help prevent vaginosis include:  Don't douche. Your vagina doesn't require cleansing other than normal bathing. Repetitive douching disrupts the normal organisms that reside in the vagina and can actually increase your risk of vaginal infection. Douching won't clear up a vaginal infection. Use a latex condom. Both female and female latex condoms may help you avoid infections spread by sexual contact. Wear cotton underwear. Also wear pantyhose with a cotton crotch. If you feel comfortable without it, skip wearing underwear to bed. Yeast thrives in [place] Your symptoms should improve in the next day or two.  GET HELP RIGHT AWAY IF:  You have pain in your lower abdomen ( pelvic area or over your ovaries) You develop nausea or vomiting You develop a fever Your discharge changes or worsens You have persistent pain with intercourse You develop shortness of breath, a rapid pulse, or you faint.  These symptoms could be signs of problems or infections that need to be evaluated by a medical provider now.  MAKE SURE YOU   Understand these instructions. Will watch  your condition. Will get help right away if you are not doing well or get worse.  Thank you for choosing an e-visit.  Your e-visit answers were reviewed by a board certified advanced clinical practitioner to complete your personal  care plan. Depending upon the condition, your plan could have included both over the counter or prescription medications.  Please review your pharmacy choice. Make sure the pharmacy is open so you can pick up prescription now. If there is a problem, you may contact your provider through [employer] and have the prescription routed to another pharmacy.  Your safety is important to [place]. If you have drug allergies check your prescription carefully.   For the next 24 hours you can use MyChart to ask questions about today's visit, request a non-urgent call back, or ask for a work or school excuse. You will get an email in the next two days asking about your experience. I hope that your e-visit has been valuable and will speed your recovery.  I have spent 5 minutes in review of e-visit questionnaire, review and updating patient chart, medical decision making and response to patient.   Mar Daring, PA-C

## 2022-07-25 ENCOUNTER — Other Ambulatory Visit: Payer: Self-pay

## 2022-07-29 ENCOUNTER — Ambulatory Visit: Payer: Medicaid Other | Attending: Internal Medicine | Admitting: Internal Medicine

## 2022-08-04 ENCOUNTER — Other Ambulatory Visit: Payer: Self-pay | Admitting: Internal Medicine

## 2022-08-04 ENCOUNTER — Other Ambulatory Visit: Payer: Self-pay

## 2022-08-04 MED ORDER — INSULIN LISPRO (1 UNIT DIAL) 100 UNIT/ML (KWIKPEN)
8.0000 [IU] | PEN_INJECTOR | Freq: Three times a day (TID) | SUBCUTANEOUS | 0 refills | Status: DC
  Filled 2022-08-04 – 2022-08-11 (×2): qty 6, 25d supply, fill #0

## 2022-08-05 ENCOUNTER — Other Ambulatory Visit: Payer: Self-pay

## 2022-08-08 ENCOUNTER — Other Ambulatory Visit: Payer: Self-pay

## 2022-08-11 ENCOUNTER — Other Ambulatory Visit: Payer: Self-pay

## 2022-08-12 ENCOUNTER — Other Ambulatory Visit: Payer: Self-pay

## 2022-08-12 ENCOUNTER — Other Ambulatory Visit: Payer: Self-pay | Admitting: Internal Medicine

## 2022-08-12 DIAGNOSIS — E669 Obesity, unspecified: Secondary | ICD-10-CM

## 2022-08-12 DIAGNOSIS — E1169 Type 2 diabetes mellitus with other specified complication: Secondary | ICD-10-CM

## 2022-08-12 NOTE — Telephone Encounter (Signed)
Requested Prescriptions  Pending Prescriptions Disp Refills   Insulin Glargine (BASAGLAR KWIKPEN) 100 UNIT/ML [Pharmacy Med Name: Basaglar KwikPen Subcutaneous Solution Pen-injector 100 UNIT/ML] 45 mL 0    Sig: DIAL AND INJECT 40 UNITS UNDER THE SKIN DAILY. MAX DAILY DOSE IS 40 UNITS.     Endocrinology:  Diabetes - Insulins Failed - 08/12/2022  8:03 AM      Failed - HBA1C is between 0 and 7.9 and within 180 days    HbA1c, POC (controlled diabetic range)  Date Value Ref Range Status  03/25/2022 10.3 (A) 0.0 - 7.0 % Final         Passed - Valid encounter within last 6 months    Recent Outpatient Visits           3 months ago Diabetes mellitus type 2 in obese [facility])   [place], Annie Main L, RPH-CPP   4 months ago Diabetes mellitus type 2 in obese [facility])   [place], Deborah B, MD   1 year ago Diabetes mellitus type 2 in obese [facility])   [facility], [place], [place]   1 year ago Diabetes mellitus type 2 in obese [facility])   [place], Deborah B, MD   1 year ago Type 2 diabetes mellitus without complication, with long-term current use of insulin [facility])   [place], En[place]ong, MD       Future Appointments             In 1 month Wynetta Emery, Dalbert Batman, MD [place]

## 2022-08-13 ENCOUNTER — Other Ambulatory Visit: Payer: Self-pay

## 2022-08-15 ENCOUNTER — Other Ambulatory Visit: Payer: Self-pay

## 2022-08-16 ENCOUNTER — Telehealth: Payer: Medicaid Other | Admitting: Nurse Practitioner

## 2022-08-16 DIAGNOSIS — N3000 Acute cystitis without hematuria: Secondary | ICD-10-CM

## 2022-08-16 MED ORDER — CEPHALEXIN 500 MG PO CAPS: 500.0000 mg | ORAL_CAPSULE | Freq: Two times a day (BID) | ORAL | 0 refills | Status: DC

## 2022-08-16 NOTE — Progress Notes (Signed)

## 2022-09-07 ENCOUNTER — Other Ambulatory Visit: Payer: Self-pay | Admitting: Internal Medicine

## 2022-09-07 DIAGNOSIS — E1169 Type 2 diabetes mellitus with other specified complication: Secondary | ICD-10-CM

## 2022-09-08 ENCOUNTER — Other Ambulatory Visit: Payer: Self-pay

## 2022-09-08 MED ORDER — ATORVASTATIN CALCIUM 10 MG PO TABS
10.0000 mg | ORAL_TABLET | Freq: Every day | ORAL | 0 refills | Status: DC
  Filled 2022-09-08 – 2022-09-15 (×2): qty 30, 30d supply, fill #0

## 2022-09-08 MED ORDER — INSULIN LISPRO (1 UNIT DIAL) 100 UNIT/ML (KWIKPEN)
8.0000 [IU] | PEN_INJECTOR | Freq: Three times a day (TID) | SUBCUTANEOUS | 0 refills | Status: DC
  Filled 2022-09-08 – 2022-09-15 (×2): qty 6, 25d supply, fill #0

## 2022-09-13 ENCOUNTER — Telehealth: Payer: Medicaid Other | Admitting: Family Medicine

## 2022-09-13 DIAGNOSIS — N3000 Acute cystitis without hematuria: Secondary | ICD-10-CM

## 2022-09-13 DIAGNOSIS — B379 Candidiasis, unspecified: Secondary | ICD-10-CM | POA: Diagnosis not present

## 2022-09-13 MED ORDER — FLUCONAZOLE 150 MG PO TABS: 150.0000 mg | ORAL_TABLET | Freq: Once | ORAL | 0 refills | Status: AC

## 2022-09-13 MED ORDER — CEPHALEXIN 500 MG PO CAPS: 500.0000 mg | ORAL_CAPSULE | Freq: Two times a day (BID) | ORAL | 0 refills | Status: DC

## 2022-09-13 NOTE — Progress Notes (Signed)
E-Visit for Urinary Problems  We are sorry that you are not feeling well.  Here is how we plan to help!  Based on what you shared with me it looks like you most likely have a simple urinary tract infection.  A UTI (Urinary Tract Infection) is a bacterial infection of the bladder.  Most cases of urinary tract infections are simple to treat but a key part of your care is to encourage you to drink plenty of fluids and watch your symptoms carefully.  I have prescribed Keflex 500 mg twice a day for 7 days along with diflucan x 1. .  Your symptoms should gradually improve. Call us if the burning in your urine worsens, you develop worsening fever, back pain or pelvic pain or if your symptoms do not resolve after completing the antibiotic.  Urinary tract infections can be prevented by drinking plenty of water to keep your body hydrated.  Also be sure when you wipe, wipe from front to back and don't hold it in!  If possible, empty your bladder every 4 hours.  HOME CARE Drink plenty of fluids Compete the full course of the antibiotics even if the symptoms resolve Remember, when you need to go.go. Holding in your urine can increase the likelihood of getting a UTI! GET HELP RIGHT AWAY IF: You cannot urinate You get a high fever Worsening back pain occurs You see blood in your urine You feel sick to your stomach or throw up You feel like you are going to pass out  MAKE SURE YOU  Understand these instructions. Will watch your condition. Will get help right away if you are not doing well or get worse.   Thank you for choosing an e-visit.  Your e-visit answers were reviewed by a board certified advanced clinical practitioner to complete your personal care plan. Depending upon the condition, your plan could have included both over the counter or prescription medications.  Please review your pharmacy choice. Make sure the pharmacy is open so you can pick up prescription now. If there is a problem,  you may contact your provider through [employer] and have the prescription routed to another pharmacy.  Your safety is important to [place]. If you have drug allergies check your prescription carefully.   For the next 24 hours you can use MyChart to ask questions about today's visit, request a non-urgent call back, or ask for a work or school excuse. You will get an email in the next two days asking about your experience. I hope that your e-visit has been valuable and will speed your recovery.    have provided 5 minutes of non face to face time during this encounter for chart review and documentation.

## 2022-09-15 ENCOUNTER — Other Ambulatory Visit: Payer: Self-pay | Admitting: Internal Medicine

## 2022-09-15 ENCOUNTER — Other Ambulatory Visit: Payer: Self-pay

## 2022-09-16 ENCOUNTER — Ambulatory Visit: Payer: Medicaid Other | Admitting: Internal Medicine

## 2022-09-16 MED ORDER — LISINOPRIL 30 MG PO TABS
30.0000 mg | ORAL_TABLET | Freq: Every day | ORAL | 0 refills | Status: DC
  Filled 2022-09-16: qty 30, 30d supply, fill #0

## 2022-09-16 NOTE — Telephone Encounter (Signed)
Last renal function from 10/16/20 - forwarding to pt's PCP for review.

## 2022-09-17 ENCOUNTER — Other Ambulatory Visit: Payer: Self-pay

## 2022-09-18 ENCOUNTER — Ambulatory Visit: Payer: Medicaid Other | Admitting: Internal Medicine

## 2022-09-22 ENCOUNTER — Other Ambulatory Visit: Payer: Self-pay

## 2022-10-02 ENCOUNTER — Ambulatory Visit: Payer: Medicaid Other | Attending: Internal Medicine | Admitting: Physician Assistant

## 2022-10-02 ENCOUNTER — Other Ambulatory Visit: Payer: Self-pay

## 2022-10-02 VITALS — BP 191/92 | HR 79 | Wt 174.0 lb

## 2022-10-02 DIAGNOSIS — E1169 Type 2 diabetes mellitus with other specified complication: Secondary | ICD-10-CM | POA: Diagnosis not present

## 2022-10-02 DIAGNOSIS — E1121 Type 2 diabetes mellitus with diabetic nephropathy: Secondary | ICD-10-CM

## 2022-10-02 DIAGNOSIS — B379 Candidiasis, unspecified: Secondary | ICD-10-CM

## 2022-10-02 DIAGNOSIS — I152 Hypertension secondary to endocrine disorders: Secondary | ICD-10-CM

## 2022-10-02 DIAGNOSIS — E669 Obesity, unspecified: Secondary | ICD-10-CM

## 2022-10-02 DIAGNOSIS — E785 Hyperlipidemia, unspecified: Secondary | ICD-10-CM

## 2022-10-02 DIAGNOSIS — E1159 Type 2 diabetes mellitus with other circulatory complications: Secondary | ICD-10-CM | POA: Diagnosis not present

## 2022-10-02 LAB — GLUCOSE, POCT (MANUAL RESULT ENTRY): POC Glucose: 293 mg/dl — AB (ref 70–99)

## 2022-10-02 LAB — POCT GLYCOSYLATED HEMOGLOBIN (HGB A1C): HbA1c, POC (controlled diabetic range): 9.5 % — AB (ref 0.0–7.0)

## 2022-10-02 MED ORDER — LISINOPRIL 30 MG PO TABS
30.0000 mg | ORAL_TABLET | Freq: Every day | ORAL | 1 refills | Status: DC
  Filled 2022-10-02 – 2022-10-10 (×4): qty 90, 90d supply, fill #0

## 2022-10-02 MED ORDER — TRUE METRIX METER W/DEVICE KIT
PACK | 0 refills | Status: DC
  Filled 2022-10-02 (×2): qty 1, fill #0

## 2022-10-02 MED ORDER — ATORVASTATIN CALCIUM 10 MG PO TABS
10.0000 mg | ORAL_TABLET | Freq: Every day | ORAL | 1 refills | Status: DC
  Filled 2022-10-02 – 2022-10-08 (×2): qty 90, 90d supply, fill #0
  Filled 2023-01-06: qty 90, 90d supply, fill #1

## 2022-10-02 MED ORDER — INSULIN LISPRO (1 UNIT DIAL) 100 UNIT/ML (KWIKPEN)
8.0000 [IU] | PEN_INJECTOR | Freq: Three times a day (TID) | SUBCUTANEOUS | 3 refills | Status: DC
  Filled 2022-10-02 – 2022-10-05 (×3): qty 6, 25d supply, fill #0
  Filled 2022-10-30: qty 6, 25d supply, fill #1
  Filled 2022-11-24: qty 6, 25d supply, fill #2
  Filled 2022-12-16: qty 6, 25d supply, fill #3

## 2022-10-02 MED ORDER — FLUCONAZOLE 150 MG PO TABS
150.0000 mg | ORAL_TABLET | Freq: Once | ORAL | 0 refills | Status: AC
  Filled 2022-10-02: qty 2, 7d supply, fill #0

## 2022-10-02 MED ORDER — TRUEPLUS PEN NEEDLES 31G X 8 MM MISC
6 refills | Status: AC
  Filled 2022-10-02: qty 100, 33d supply, fill #0
  Filled 2022-10-30: qty 100, 33d supply, fill #1
  Filled 2022-12-01 – 2022-12-09 (×2): qty 100, 33d supply, fill #2
  Filled 2023-01-07: qty 100, 33d supply, fill #3
  Filled 2023-02-11: qty 100, 33d supply, fill #4
  Filled 2023-03-17 – 2023-04-02 (×2): qty 100, 33d supply, fill #5
  Filled 2023-05-07: qty 100, 33d supply, fill #6

## 2022-10-02 MED ORDER — METFORMIN HCL 1000 MG PO TABS
1000.0000 mg | ORAL_TABLET | Freq: Two times a day (BID) | ORAL | 6 refills | Status: DC
  Filled 2022-10-02 – 2022-10-08 (×2): qty 60, 30d supply, fill #0
  Filled 2022-11-09: qty 60, 30d supply, fill #1
  Filled 2022-12-09: qty 60, 30d supply, fill #2
  Filled 2023-01-05: qty 60, 30d supply, fill #3
  Filled 2023-02-03: qty 60, 30d supply, fill #4
  Filled 2023-03-07: qty 60, 30d supply, fill #5
  Filled 2023-04-06: qty 60, 30d supply, fill #6

## 2022-10-02 MED ORDER — TRUEPLUS LANCETS 28G MISC
1.0000 | Freq: Three times a day (TID) | 2 refills | Status: DC
  Filled 2022-10-02 (×2): qty 100, 34d supply, fill #0

## 2022-10-02 MED ORDER — LANTUS 100 UNIT/ML ~~LOC~~ SOLN
50.0000 [IU] | Freq: Every day | SUBCUTANEOUS | 3 refills | Status: DC
  Filled 2022-10-02: qty 40, 80d supply, fill #0

## 2022-10-02 MED ORDER — TRUE METRIX BLOOD GLUCOSE TEST VI STRP
1.0000 | ORAL_STRIP | Freq: Three times a day (TID) | 2 refills | Status: AC
  Filled 2022-10-02: qty 100, 34d supply, fill #0
  Filled 2022-10-30 – 2022-11-24 (×4): qty 100, 34d supply, fill #1
  Filled 2023-01-02: qty 100, 34d supply, fill #2

## 2022-10-02 NOTE — Progress Notes (Signed)
Patient ID: Emily Clay, female   DOB: 1970/09/11, 52 y.o.   MRN: [id number]   Emily Clay, is a 52 y.o. female  [id number]  [id number]  DOB - Feb 26, 1971  Chief Complaint  Patient presents with   Diabetes       Subjective:   Emily Clay is a 52 y.o. female here today for diabetes check and recurrent yeast infections.  Lost her glucometer and needs another one.  Not checking blood sugars. No hypoglycemia.  Sometimes forgets to take humalog but remembers basaglar  No problems updated.  ALLERGIES: No Known Allergies  PAST MEDICAL HISTORY: Past Medical History:  Diagnosis Date   Diabetes mellitus    Hypertension    Proteinuria     MEDICATIONS AT HOME: Prior to Admission medications   Medication Sig Start Date End Date Taking? Authorizing Provider  Blood Pressure Monitoring (BLOOD PRESSURE CUFF) MISC Take BP 1-2 times daily, write numbers down in a book and bring to your primary care doctor appointment. 07/10/22  Yes Talbot Grumbling, FNP  cephALEXin (KEFLEX) 500 MG capsule Take 1 capsule (500 mg total) by mouth 2 (two) times daily. 09/13/22  Yes Tempie Hoist, FNP  fluconazole (DIFLUCAN) 150 MG tablet Take 1 tablet (150 mg total) by mouth once for 1 dose. Repeat in 1 week 10/02/22 10/02/22 Yes Carlissa Pesola, Dionne Bucy, PA-C  insulin glargine (LANTUS) 100 UNIT/ML injection Inject 0.5 mLs (50 Units total) into the skin at bedtime. 10/02/22  Yes Yitzel Shasteen, Dionne Bucy, PA-C  meloxicam (MOBIC) 15 MG tablet Take 1 tablet (15 mg total) by mouth daily. 03/17/22  Yes Mar Daring, PA-C  atorvastatin (LIPITOR) 10 MG tablet Take 1 tablet (10 mg total) by mouth once daily. 10/02/22   Argentina Donovan, PA-C  Blood Glucose Monitoring Suppl (TRUE METRIX METER) w/Device KIT Use to check blood sugar 3 times daily. 10/02/22   Argentina Donovan, PA-C  glucose blood (TRUE METRIX BLOOD GLUCOSE TEST) test strip Use 1 strip to check blood sugar 3 times daily . 10/02/22   Argentina Donovan, PA-C   insulin lispro (HUMALOG KWIKPEN) 100 UNIT/ML KwikPen Inject 8 Units into the skin 3 (three) times daily with meals. 10/02/22   Argentina Donovan, PA-C  Insulin Pen Needle (TRUEPLUS PEN NEEDLES) 31G X 8 MM MISC USE AS DIRECTED 10/02/22   Argentina Donovan, PA-C  lisinopril (ZESTRIL) 30 MG tablet Take 1 tablet (30 mg total) by mouth daily. Must keep upcoming appointment 09/2022 10/02/22   Argentina Donovan, PA-C  metFORMIN (GLUCOPHAGE) 1000 MG tablet Take 1 tablet (1,000 mg total) by mouth 2 (two) times daily with a meal. 10/02/22   Chriselda Leppert, Dionne Bucy, PA-C  TRUEplus Lancets 28G MISC Use 1 lancet to check blood sugar 3 times daily. 10/02/22   Mahogony Gilchrest, Dionne Bucy, PA-C    ROS: Neg HEENT Neg resp Neg cardiac Neg GI Neg GU Neg MS Neg psych Neg neuro  Objective:   Vitals:   10/02/22 1623  BP: (!) 191/92  Pulse: 79  SpO2: 97%  Weight: 174 lb (78.9 kg)   Exam General appearance : Awake, alert, not in any distress. Speech Clear. Not toxic looking HEENT: Atraumatic and Normocephalic Neck: Supple, no JVD. No cervical lymphadenopathy.  Chest: Good air entry bilaterally, CTAB.  No rales/rhonchi/wheezing CVS: S1 S2 regular, no murmurs.  Extremities: B/L Lower Ext shows no edema, both legs are warm to touch Neurology: Awake alert, and oriented X 3, CN II-XII intact, Non focal Skin: No  Rash  Data Review Lab Results  Component Value Date   HGBA1C 9.5 (A) 10/02/2022   HGBA1C 10.3 (A) 03/25/2022   HGBA1C 8.2 (H) 04/04/2021    Assessment & Plan   1. 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  Increase basaglar(she reports 44 u daily) to 50 u daily - Glucose (CBG) - HgB A1c - Comprehensive metabolic panel - Lipid panel - CBC with Differential/Platelet - Microalbumin/Creatinine Ratio, Urine Check blood sugar fasting and bedtime and bring readings to next visit  Drink 80-100 ounces water daily  2. Hypertension associated with diabetes  ([place]) Has been out of meds but says BP OOO is about 120-130/80s - Comprehensive metabolic panel - Lipid panel - Microalbumin/Creatinine Ratio, Urine - lisinopril (ZESTRIL) 30 MG tablet; Take 1 tablet (30 mg total) by mouth daily. Must keep upcoming appointment 09/2022  Dispense: 90 tablet; Refill: 1  3. Hyperlipidemia associated with type 2 diabetes mellitus (HCC) - Lipid panel - Microalbumin/Creatinine Ratio, Urine - atorvastatin (LIPITOR) 10 MG tablet; Take 1 tablet (10 mg total) by mouth once daily.  Dispense: 90 tablet; Refill: 1  4. Diabetes mellitus type 2 in obese (HCC - metFORMIN (GLUCOPHAGE) 1000 MG tablet; Take 1 tablet (1,000 mg total) by mouth 2 (two) times daily with a meal.  Dispense: 60 tablet; Refill: 6 - insulin lispro (HUMALOG KWIKPEN) 100 UNIT/ML KwikPen; Inject 8 Units into the skin 3 (three) times daily with meals.  Dispense: 6 mL; Refill: 3 - TRUEplus Lancets 28G MISC; Use 1 lancet to check blood sugar 3 times daily.  Dispense: 100 each; Refill: 2 - Insulin Pen Needle (TRUEPLUS PEN NEEDLES) 31G X 8 MM MISC; USE AS DIRECTED  Dispense: 100 each; Refill: 6 - Blood Glucose Monitoring Suppl (TRUE METRIX METER) w/Device KIT; Use to check blood sugar 3 times daily.  Dispense: 1 kit; Refill: 0  5. Yeast infection Diflucan X 2 sent and blood sugar control is imperative!!    Return for 1 month Luke; 3 months PCP.  The patient was given clear instructions to go to ER or return to medical center if symptoms don't improve, worsen or new problems develop. The patient verbalized understanding. The patient was told to call to get lab results if they haven't heard anything in the next week.      Freeman Caldron, PA-C [facility] [place], [place]   10/02/2022, 4:40 PM

## 2022-10-02 NOTE — Patient Instructions (Signed)
Check blood sugar fasting and bedtime and bring readings to next visit  Drink 80-100 ounces water daily

## 2022-10-03 ENCOUNTER — Other Ambulatory Visit: Payer: Self-pay | Admitting: Pharmacist

## 2022-10-03 ENCOUNTER — Other Ambulatory Visit: Payer: Self-pay

## 2022-10-03 DIAGNOSIS — E119 Type 2 diabetes mellitus without complications: Secondary | ICD-10-CM

## 2022-10-03 LAB — COMPREHENSIVE METABOLIC PANEL
ALT: 22 IU/L (ref 0–32)
AST: 21 IU/L (ref 0–40)
Albumin/Globulin Ratio: 1.5 (ref 1.2–2.2)
Albumin: 4.2 g/dL (ref 3.9–4.9)
Alkaline Phosphatase: 56 IU/L (ref 44–121)
BUN/Creatinine Ratio: 19 (ref 9–23)
BUN: 16 mg/dL (ref 6–24)
Bilirubin Total: 0.2 mg/dL (ref 0.0–1.2)
CO2: 21 mmol/L (ref 20–29)
Calcium: 9.5 mg/dL (ref 8.7–10.2)
Chloride: 104 mmol/L (ref 96–106)
Creatinine, Ser: 0.86 mg/dL (ref 0.57–1.00)
Globulin, Total: 2.8 g/dL (ref 1.5–4.5)
Glucose: 290 mg/dL — ABNORMAL HIGH (ref 70–99)
Potassium: 4.3 mmol/L (ref 3.5–5.2)
Sodium: 141 mmol/L (ref 134–144)
Total Protein: 7 g/dL (ref 6.0–8.5)
eGFR: 83 mL/min/{1.73_m2} (ref >59)

## 2022-10-03 LAB — CBC WITH DIFFERENTIAL/PLATELET
Basophils Absolute: 0.1 10*3/uL (ref 0.0–0.2)
Basos: 1 %
EOS (ABSOLUTE): 0.1 10*3/uL (ref 0.0–0.4)
Eos: 1 %
Hematocrit: 38.6 % (ref 34.0–46.6)
Hemoglobin: 13 g/dL (ref 11.1–15.9)
Immature Grans (Abs): 0 10*3/uL (ref 0.0–0.1)
Immature Granulocytes: 0 %
Lymphocytes Absolute: 1.5 10*3/uL (ref 0.7–3.1)
Lymphs: 25 %
MCH: 31.1 pg (ref 26.6–33.0)
MCHC: 33.7 g/dL (ref 31.5–35.7)
MCV: 92 fL (ref 79–97)
Monocytes Absolute: 0.4 10*3/uL (ref 0.1–0.9)
Monocytes: 6 %
Neutrophils Absolute: 4.2 10*3/uL (ref 1.4–7.0)
Neutrophils: 67 %
Platelets: 323 10*3/uL (ref 150–450)
RBC: 4.18 x10E6/uL (ref 3.77–5.28)
RDW: 12.2 % (ref 11.7–15.4)
WBC: 6.2 10*3/uL (ref 3.4–10.8)

## 2022-10-03 LAB — LIPID PANEL
Chol/HDL Ratio: 2.1 ratio (ref 0.0–4.4)
Cholesterol, Total: 155 mg/dL (ref 100–199)
HDL: 74 mg/dL (ref >39)
LDL Chol Calc (NIH): 70 mg/dL (ref 0–99)
Triglycerides: 49 mg/dL (ref 0–149)
VLDL Cholesterol Cal: 11 mg/dL (ref 5–40)

## 2022-10-03 MED ORDER — ACCU-CHEK GUIDE W/DEVICE KIT
PACK | 0 refills | Status: DC
  Filled 2022-10-03: qty 1, 30d supply, fill #0

## 2022-10-03 MED ORDER — ACCU-CHEK GUIDE VI STRP
ORAL_STRIP | 6 refills | Status: AC
  Filled 2022-10-03: qty 100, fill #0
  Filled 2022-10-05 – 2022-10-28 (×3): qty 100, 33d supply, fill #0
  Filled 2022-11-28 – 2023-02-02 (×8): qty 100, 33d supply, fill #1
  Filled 2023-03-07: qty 100, 33d supply, fill #2

## 2022-10-03 MED ORDER — ACCU-CHEK SOFTCLIX LANCETS MISC
6 refills | Status: AC
  Filled 2022-10-03: qty 100, 33d supply, fill #0
  Filled 2022-11-01 – 2022-11-11 (×2): qty 100, 33d supply, fill #1
  Filled 2022-12-09: qty 100, 33d supply, fill #2
  Filled 2023-01-12 – 2023-02-02 (×2): qty 100, 33d supply, fill #3
  Filled 2023-03-19 – 2023-04-09 (×4): qty 100, 33d supply, fill #4

## 2022-10-06 ENCOUNTER — Other Ambulatory Visit: Payer: Self-pay

## 2022-10-07 ENCOUNTER — Other Ambulatory Visit: Payer: Self-pay

## 2022-10-08 ENCOUNTER — Other Ambulatory Visit: Payer: Self-pay

## 2022-10-08 ENCOUNTER — Other Ambulatory Visit: Payer: Self-pay | Admitting: Physician Assistant

## 2022-10-08 MED ORDER — INSULIN GLARGINE SOLOSTAR 100 UNIT/ML ~~LOC~~ SOPN
50.0000 [IU] | PEN_INJECTOR | Freq: Every day | SUBCUTANEOUS | 3 refills | Status: DC
  Filled 2022-10-08: qty 9, 18d supply, fill #0
  Filled 2022-10-26: qty 9, 18d supply, fill #1
  Filled 2022-11-11: qty 9, 18d supply, fill #2
  Filled 2022-12-01 – 2022-12-09 (×4): qty 9, 18d supply, fill #3

## 2022-10-09 ENCOUNTER — Other Ambulatory Visit: Payer: Self-pay

## 2022-10-09 NOTE — Progress Notes (Signed)
Patient outreached by Lazarus Gowda, PharmD Candidate on 10/08/2022 to discuss hypertension   Patient does not have an automated home blood pressure machine.   Medication review was performed. They are not taking medications as prescribed. Differences from their prescribed list include: not taking lisinopril, lost the medication. Will let pharmacy know to refill.   The following barriers to adherence were noted:  - They do not have cost concerns.  - They do not have transportation concerns.  - They do need assistance obtaining refills.  - They do occasionally forget to take some of their prescribed medications.  - They do not feel like one/some of their medications make them feel poorly.  - They do not have questions or concerns about their medications.  - They do have follow up scheduled with their primary care provider/cardiologist.   The following interventions were completed:  - Medications were reviewed  - Patient was educated on proper technique to check home blood pressure and reminded to bring home machine and readings to next provider appointment  - Patient was educated on medications, including indication and administration   The patient has follow up scheduled w/ Benard Halsted, CPP on 11/10/2022   Lazarus Gowda, PharmD Candidate   Joseph Art, Pharm.D. PGY-2 Ambulatory Care Pharmacy Resident

## 2022-10-10 ENCOUNTER — Other Ambulatory Visit: Payer: Self-pay

## 2022-10-27 ENCOUNTER — Other Ambulatory Visit: Payer: Self-pay

## 2022-10-28 ENCOUNTER — Other Ambulatory Visit: Payer: Self-pay

## 2022-10-30 ENCOUNTER — Encounter: Payer: Self-pay | Admitting: Pharmacist

## 2022-10-30 ENCOUNTER — Other Ambulatory Visit: Payer: Self-pay

## 2022-10-31 ENCOUNTER — Other Ambulatory Visit: Payer: Self-pay

## 2022-11-07 ENCOUNTER — Other Ambulatory Visit: Payer: Self-pay

## 2022-11-10 ENCOUNTER — Encounter: Payer: Self-pay | Admitting: Pharmacist

## 2022-11-10 ENCOUNTER — Other Ambulatory Visit: Payer: Self-pay

## 2022-11-10 ENCOUNTER — Ambulatory Visit: Payer: Medicaid Other | Attending: Internal Medicine | Admitting: Pharmacist

## 2022-11-10 VITALS — BP 164/71 | HR 89

## 2022-11-10 DIAGNOSIS — I10 Essential (primary) hypertension: Secondary | ICD-10-CM | POA: Diagnosis not present

## 2022-11-10 MED ORDER — OLMESARTAN MEDOXOMIL-HCTZ 40-12.5 MG PO TABS
1.0000 | ORAL_TABLET | Freq: Every day | ORAL | 0 refills | Status: DC
  Filled 2022-11-10: qty 90, 90d supply, fill #0

## 2022-11-10 NOTE — Progress Notes (Signed)
.    S:    PCP: Dr. Laural Benes  52 y.o. female who presents for hypertension evaluation, education, and management. PMH is significant for HTN and T2DM.  Patient was referred by Georgian Co, PA-C on 10/02/2022 where BP was elevated at 191/92 mmHg. No changes were made to her medication regimen at that time. It was also noted that she had recently lost her lisinopril prescription.   Today, patient arrives in good spirits and presents without assistance. Denies dizziness, headache, blurred vision, swelling. States she was able to get a refill of lisinopril and has been taking without issue. BP in clinic elevated x2.   Patient reports she cannot remember when hypertension was diagnosed. She was started on lisinopril for proteinuria.    Family/Social history:  MI/stroke: none CKD: none Tobacco: rarely (1 black and mild a week) Alcohol (2 cups of wine per night)   Medication adherence reported. Patient has taken BP medications today.   Current antihypertensives include: lisinopril   Reported home BP readings: not checking  Patient reported dietary habits:  Tries to limit salt intake   Patient-reported exercise habits: gets exercise at work, works in cafe at [employer]  O:  ROS  Physical Exam  Last 3 Office BP readings: BP Readings from Last 3 Encounters:  10/02/22 (!) 191/92  07/10/22 (!) 202/90  03/25/22 (!) 164/83    BMET    Component Value Date/Time   NA 141 10/02/2022 1650   K 4.3 10/02/2022 1650   CL 104 10/02/2022 1650   CO2 21 10/02/2022 1650   GLUCOSE 290 (H) 10/02/2022 1650   GLUCOSE 188 (H) 07/06/2019 1551   BUN 16 10/02/2022 1650   CREATININE 0.86 10/02/2022 1650   CREATININE 0.70 07/25/2013 1246   CALCIUM 9.5 10/02/2022 1650   GFRNONAA 80 01/27/2020 1510   GFRNONAA >89 07/25/2013 1246   GFRAA 92 01/27/2020 1510   GFRAA >89 07/25/2013 1246    Renal function: CrCl cannot be calculated (Patient's most recent lab result is older than the maximum 21 days  allowed.).  Clinical ASCVD: No  The 10-year ASCVD risk score (Arnett DK, et al., 2019) is: 19.4%   Values used to calculate the score:     Age: 62 years     Sex: Female     Is Non-Hispanic African American: Yes     Diabetic: Yes     Tobacco smoker: No     Systolic Blood Pressure: 191 mmHg     Is BP treated: Yes     HDL Cholesterol: 74 mg/dL     Total Cholesterol: 155 mg/dL   A/P: Hypertension, longstanding currently uncontrolled on current medications. BP goal <130/80 mmHg. Medication adherence appears appropriate. Control is suboptimal due to patient needing therapy intensification. Patient would likely benefit from single pill regimen.   -Discontinued lisinopril.  -Started olmesartan-HCTZ 40-12.5 mg once daily. CMP stable 10/02/2022. -Patient educated on purpose, proper use, and potential adverse effects of olmesartan-HCTZ.  -Counseled on lifestyle modifications for blood pressure control including reduced dietary sodium, increased exercise, adequate sleep. -Encouraged patient to check BP at home and bring log of readings to next visit. Counseled on proper use of home BP cuff.   Results reviewed and written information provided.    Written patient instructions provided. Patient verbalized understanding of treatment plan.  Total time in face to face counseling 30 minutes.    Follow-up:  Pharmacist 1 month. PCP clinic visit in 01/06/2023.   Valeda Malm, Pharm.D. PGY-2 Ambulatory Care Pharmacy Resident

## 2022-11-11 ENCOUNTER — Telehealth: Payer: Medicaid Other | Admitting: Physician Assistant

## 2022-11-11 DIAGNOSIS — B3731 Acute candidiasis of vulva and vagina: Secondary | ICD-10-CM

## 2022-11-11 MED ORDER — FLUCONAZOLE 150 MG PO TABS: 150.0000 mg | ORAL_TABLET | ORAL | 0 refills | Status: DC | PRN

## 2022-11-11 NOTE — Progress Notes (Signed)
E-Visit for Vaginal Symptoms  We are sorry that you are not feeling well. Here is how we plan to help! Based on what you shared with me it looks like you: May have a yeast vaginosis  Vaginosis is an inflammation of the vagina that can result in discharge, itching and pain. The cause is usually a change in the normal balance of vaginal bacteria or an infection. Vaginosis can also result from reduced estrogen levels after menopause.  The most common causes of vaginosis are:   Bacterial vaginosis which results from an overgrowth of one on several organisms that are normally present in your vagina.   Yeast infections which are caused by a naturally occurring fungus called candida.   Vaginal atrophy (atrophic vaginosis) which results from the thinning of the vagina from reduced estrogen levels after menopause.   Trichomoniasis which is caused by a parasite and is commonly transmitted by sexual intercourse.  Factors that increase your risk of developing vaginosis include: Medications, such as antibiotics and steroids Uncontrolled diabetes Use of hygiene products such as bubble bath, vaginal spray or vaginal deodorant Douching Wearing damp or tight-fitting clothing Using an intrauterine device (IUD) for birth control Hormonal changes, such as those associated with pregnancy, birth control pills or menopause Sexual activity Having a sexually transmitted infection  Your treatment plan is Diflucan (fluconazole) 150mg tablet once, repeat in 72 hours if needed.  I have electronically sent this prescription into the pharmacy that you have chosen.  Be sure to take all of the medication as directed. Stop taking any medication if you develop a rash, tongue swelling or shortness of breath. Mothers who are breast feeding should consider pumping and discarding their breast milk while on these antibiotics. However, there is no consensus that infant exposure at these doses would be harmful.  Remember that  medication creams can weaken latex condoms. .   HOME CARE:  Good hygiene may prevent some types of vaginosis from recurring and may relieve some symptoms:  Avoid baths, hot tubs and whirlpool spas. Rinse soap from your outer genital area after a shower, and dry the area well to prevent irritation. Don't use scented or harsh soaps, such as those with deodorant or antibacterial action. Avoid irritants. These include scented tampons and pads. Wipe from front to back after using the toilet. Doing so avoids spreading fecal bacteria to your vagina.  Other things that may help prevent vaginosis include:  Don't douche. Your vagina doesn't require cleansing other than normal bathing. Repetitive douching disrupts the normal organisms that reside in the vagina and can actually increase your risk of vaginal infection. Douching won't clear up a vaginal infection. Use a latex condom. Both female and female latex condoms may help you avoid infections spread by sexual contact. Wear cotton underwear. Also wear pantyhose with a cotton crotch. If you feel comfortable without it, skip wearing underwear to bed. Yeast thrives in moist environments Your symptoms should improve in the next day or two.  GET HELP RIGHT AWAY IF:  You have pain in your lower abdomen ( pelvic area or over your ovaries) You develop nausea or vomiting You develop a fever Your discharge changes or worsens You have persistent pain with intercourse You develop shortness of breath, a rapid pulse, or you faint.  These symptoms could be signs of problems or infections that need to be evaluated by a medical provider now.  MAKE SURE YOU   Understand these instructions. Will watch your condition. Will get help right away   if you are not doing well or get worse.  Thank you for choosing an e-visit.  Your e-visit answers were reviewed by a board certified advanced clinical practitioner to complete your personal care plan. Depending upon the  condition, your plan could have included both over the counter or prescription medications.  Please review your pharmacy choice. Make sure the pharmacy is open so you can pick up prescription now. If there is a problem, you may contact your provider through MyChart messaging and have the prescription routed to another pharmacy.  Your safety is important to us. If you have drug allergies check your prescription carefully.   For the next 24 hours you can use MyChart to ask questions about today's visit, request a non-urgent call back, or ask for a work or school excuse. You will get an email in the next two days asking about your experience. I hope that your e-visit has been valuable and will speed your recovery.  I have spent 5 minutes in review of e-visit questionnaire, review and updating patient chart, medical decision making and response to patient.   Takeo Harts M Ausencio Vaden, PA-C  

## 2022-11-12 ENCOUNTER — Other Ambulatory Visit: Payer: Self-pay

## 2022-11-24 ENCOUNTER — Other Ambulatory Visit: Payer: Self-pay

## 2022-11-28 ENCOUNTER — Other Ambulatory Visit: Payer: Self-pay

## 2022-12-01 ENCOUNTER — Other Ambulatory Visit: Payer: Self-pay

## 2022-12-08 ENCOUNTER — Other Ambulatory Visit: Payer: Self-pay

## 2022-12-09 ENCOUNTER — Other Ambulatory Visit: Payer: Self-pay

## 2022-12-11 ENCOUNTER — Ambulatory Visit: Payer: Medicaid Other | Admitting: Pharmacist

## 2022-12-12 ENCOUNTER — Other Ambulatory Visit: Payer: Self-pay

## 2022-12-16 ENCOUNTER — Other Ambulatory Visit: Payer: Self-pay

## 2022-12-17 ENCOUNTER — Other Ambulatory Visit: Payer: Self-pay

## 2022-12-19 ENCOUNTER — Other Ambulatory Visit: Payer: Self-pay

## 2022-12-23 ENCOUNTER — Other Ambulatory Visit: Payer: Self-pay | Admitting: Physician Assistant

## 2022-12-24 ENCOUNTER — Other Ambulatory Visit: Payer: Self-pay

## 2022-12-24 MED ORDER — INSULIN GLARGINE SOLOSTAR 100 UNIT/ML ~~LOC~~ SOPN
50.0000 [IU] | PEN_INJECTOR | Freq: Every day | SUBCUTANEOUS | 0 refills | Status: DC
  Filled 2022-12-24: qty 15, 30d supply, fill #0

## 2022-12-25 ENCOUNTER — Encounter (HOSPITAL_COMMUNITY): Payer: Self-pay

## 2022-12-25 ENCOUNTER — Ambulatory Visit (HOSPITAL_COMMUNITY)
Admission: EM | Admit: 2022-12-25 | Discharge: 2022-12-25 | Disposition: A | Discharge status: Home / Self Care | Payer: Medicaid Other | Attending: Internal Medicine | Admitting: Internal Medicine

## 2022-12-25 ENCOUNTER — Ambulatory Visit (INDEPENDENT_AMBULATORY_CARE_PROVIDER_SITE_OTHER): Payer: Medicaid Other

## 2022-12-25 DIAGNOSIS — M545 Low back pain, unspecified: Secondary | ICD-10-CM | POA: Insufficient documentation

## 2022-12-25 DIAGNOSIS — M7918 Myalgia, other site: Secondary | ICD-10-CM

## 2022-12-25 MED ORDER — MELOXICAM 15 MG PO TABS: 15.0000 mg | ORAL_TABLET | Freq: Every day | ORAL | 0 refills | Status: DC

## 2022-12-25 NOTE — ED Triage Notes (Signed)
Pt states that leg, back pain right side pain. Left side pain x4-5 days. Right finger pain x 1 month. No injury. Put "Cream" on the leg and back. Nothing for finger. Pain in side is a 10/10

## 2022-12-25 NOTE — Discharge Instructions (Addendum)
Please take medications as recommended X-ray is negative for fracture Heating pad use only 20 minutes on-20 minutes off cycle If you have worsening symptoms please return to urgent care to be reevaluated. Will call you with recommendations if labs are abnormal.

## 2022-12-26 NOTE — ED Provider Notes (Signed)
[facility]    CSN: [id number] Arrival date & time: 12/25/22  1639      History   Chief Complaint Chief Complaint  Patient presents with   Leg Pain   Back Pain   Hand Pain    HPI Emily Clay is a 52 y.o. female comes to the urgent care with 4 to 5-day history of right thigh pain, left sided abdominal pain and right middle finger pain.  Patient denies any trauma or heavy lifting.  Pain is mild to moderate in severity and has been persistent.  No known aggravating or relieving factors.  Pain does not radiate.  No bruising, recent fall or trauma.  Patient denies any heavy lifting.  She works in [employer].  The right middle finger is painful with movement and with some joint swelling.  No erythema.  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      Home Medications    Prior to Admission medications   Medication Sig Start Date End Date Taking? Authorizing Provider  Accu-Chek Softclix Lancets lancets Use to check blood sugar 3 times daily. E11.9 10/03/22  Yes Marcine Matar, MD  atorvastatin (LIPITOR) 10 MG tablet Take 1 tablet (10 mg total) by mouth once daily. 10/02/22  Yes Georgian Co M, PA-C  Blood Glucose Monitoring Suppl (ACCU-CHEK GUIDE) w/Device KIT Use to check blood sugar 3 times daily. E11.9 10/03/22  Yes Marcine Matar, MD  Blood Pressure Monitoring (BLOOD PRESSURE CUFF) MISC Take BP 1-2 times daily, write numbers down in a book and bring to your primary care doctor  appointment. 07/10/22  Yes Carlisle Beers, FNP  glucose blood (ACCU-CHEK GUIDE) test strip Use to check blood sugar 3 times daily. E11.9 10/03/22  Yes Marcine Matar, MD  glucose blood (TRUE METRIX BLOOD GLUCOSE TEST) test strip Use 1 strip to check blood sugar 3 times daily . 10/02/22  Yes McClung, Angela M, PA-C  Insulin Glargine Solostar (LANTUS) 100 UNIT/ML Solostar Pen Inject 50 Units into the skin daily. 12/24/22  Yes Marcine Matar, MD  insulin lispro (HUMALOG KWIKPEN) 100 UNIT/ML KwikPen Inject 8 Units into the skin 3 (three) times daily with meals. 10/02/22  Yes Anders Simmonds, PA-C  Insulin Pen Needle (TRUEPLUS PEN NEEDLES) 31G X 8 MM MISC USE AS DIRECTED 10/02/22  Yes McClung, Angela M, PA-C  metFORMIN (GLUCOPHAGE) 1000 MG tablet Take 1 tablet (1,000 mg total) by mouth 2 (two) times daily with a meal. 10/02/22  Yes McClung, Angela M, PA-C  olmesartan-hydrochlorothiazide (BENICAR HCT) 40-12.5 MG tablet Take 1 tablet by mouth daily. 11/10/22  Yes Marcine Matar, MD  meloxicam (MOBIC) 15 MG tablet Take 1 tablet (15 mg total) by mouth daily. 12/25/22   Darell Saputo, Britta Mccreedy, MD    Family History Family History  Problem Relation Age of Onset   Diabetes Mother    Hypertension Mother    Cancer Mother    Diabetes Sister  Hypertension Sister    Hypertension Brother    Diabetes Maternal Aunt     Social History Social History   Tobacco Use   Smoking status: Former    Types: Cigarettes   Smokeless tobacco: Never  Vaping Use   Vaping Use: Never used  Substance Use Topics   Alcohol use: Yes    Alcohol/week: 4.0 standard drinks of alcohol    Types: 4 Cans of beer per week   Drug use: No     Allergies   Patient has no known allergies.   Review of Systems Review of Systems As per HPI  Physical Exam Triage Vital Signs ED Triage Vitals  Enc Vitals Group     BP 12/25/22 1717 (!) 189/83     Pulse Rate 12/25/22 1717 70     Resp 12/25/22 1717 18     Temp 12/25/22  1717 98.1 F (36.7 C)     Temp Source 12/25/22 1717 Oral     SpO2 12/25/22 1717 96 %     Weight 12/25/22 1716 174 lb (78.9 kg)     Height --      Head Circumference --      Peak Flow --      Pain Score 12/25/22 1716 10     Pain Loc --      Pain Edu? --      Excl. in GC? --    No data found.  Updated Vital Signs BP (!) 189/83 (BP Location: Right Arm)   Pulse 70   Temp 98.1 F (36.7 C) (Oral)   Resp 18   Wt 78.9 kg   SpO2 96%   BMI 32.88 kg/m   Visual Acuity Right Eye Distance:   Left Eye Distance:   Bilateral Distance:    Right Eye Near:   Left Eye Near:    Bilateral Near:     Physical Exam Vitals and nursing note reviewed.  Constitutional:      General: She is not in acute distress.    Appearance: She is not ill-appearing.  Cardiovascular:     Rate and Rhythm: Normal rate and regular rhythm.     Pulses: Normal pulses.     Heart sounds: Normal heart sounds.  Pulmonary:     Effort: Pulmonary effort is normal.     Breath sounds: Normal breath sounds.  Abdominal:     General: Bowel sounds are normal.     Palpations: Abdomen is soft.  Musculoskeletal:        General: Normal range of motion.     Comments: Full range of motion of the PIP of the right middle finger.  Mild swelling of the PIP of the right middle finger.  Neurological:     Mental Status: She is alert.      UC Treatments / Results  Labs (all labs ordered are listed, but only abnormal results are displayed) Labs Reviewed  MISC [facility] TEST (SEND OUT)    EKG   Radiology DG Finger Middle Right  Result Date: 12/25/2022 CLINICAL DATA:  Pain and swelling in the third digit, initial encounter EXAM: RIGHT MIDDLE FINGER 2+V COMPARISON:  None Available. FINDINGS: Mild soft tissue swelling is noted particularly about the proximal interphalangeal joint. No acute fracture or dislocation is noted. No foreign body is seen. IMPRESSION: Soft tissue swelling without acute bony abnormality. Electronically  Signed   By: Alcide Clever M.D.   On: 12/25/2022 18:18    Procedures Procedures (including critical care time)  Medications  Ordered in UC Medications - No data to display  Initial Impression / Assessment and Plan / UC Course  I have reviewed the triage vital signs and the nursing notes.  Pertinent labs & imaging results that were available during my care of the patient were reviewed by me and considered in my medical decision making (see chart for details).     1.  Musculoskeletal pain involving multiple joints: X-ray of the right middle finger is negative for fracture Musculoskeletal pain may be related to wear and tear or may be related to an orthopedic condition such as rheumatoid arthritis Meloxicam 15 mg orally daily Return precautions given  Final Clinical Impressions(s) / UC Diagnoses   Final diagnoses:  Musculoskeletal pain     Discharge Instructions      Please take medications as recommended X-ray is negative for fracture Heating pad use only 20 minutes on-20 minutes off cycle If you have worsening symptoms please return to urgent care to be reevaluated. Will call you with recommendations if labs are abnormal.   ED Prescriptions     Medication Sig Dispense Auth. Provider   meloxicam (MOBIC) 15 MG tablet Take 1 tablet (15 mg total) by mouth daily. 30 tablet Ani Deoliveira, Britta Mccreedy, MD      PDMP not reviewed this encounter.   Merrilee Jansky, MD 12/26/22 1328

## 2022-12-27 LAB — MISC LABCORP TEST (SEND OUT): Labcorp test code: 81950

## 2022-12-29 ENCOUNTER — Other Ambulatory Visit: Payer: Self-pay

## 2022-12-30 ENCOUNTER — Other Ambulatory Visit: Payer: Self-pay

## 2023-01-01 ENCOUNTER — Other Ambulatory Visit: Payer: Self-pay

## 2023-01-01 ENCOUNTER — Encounter (HOSPITAL_COMMUNITY): Payer: Self-pay | Admitting: Emergency Medicine

## 2023-01-01 ENCOUNTER — Ambulatory Visit (HOSPITAL_COMMUNITY)
Admission: EM | Admit: 2023-01-01 | Discharge: 2023-01-01 | Disposition: A | Discharge status: Home / Self Care | Payer: Medicaid Other | Attending: Emergency Medicine | Admitting: Emergency Medicine

## 2023-01-01 DIAGNOSIS — S0501XA Injury of conjunctiva and corneal abrasion without foreign body, right eye, initial encounter: Secondary | ICD-10-CM | POA: Diagnosis not present

## 2023-01-01 MED ORDER — TETRACAINE HCL 0.5 % OP SOLN
OPHTHALMIC | Status: AC
  Filled 2023-01-01: qty 4

## 2023-01-01 MED ORDER — FLUORESCEIN SODIUM 1 MG OP STRP: 1.0000 | ORAL_STRIP | Freq: Once | OPHTHALMIC | Status: DC

## 2023-01-01 MED ORDER — MOXIFLOXACIN HCL 0.5 % OP SOLN: 1.0000 [drp] | Freq: Three times a day (TID) | OPHTHALMIC | 0 refills | Status: DC

## 2023-01-01 MED ORDER — TETRACAINE HCL 0.5 % OP SOLN: 1.0000 [drp] | Freq: Once | OPHTHALMIC | Status: DC

## 2023-01-01 MED ORDER — FLUORESCEIN SODIUM 1 MG OP STRP
ORAL_STRIP | OPHTHALMIC | Status: AC
  Filled 2023-01-01: qty 1

## 2023-01-01 NOTE — Discharge Instructions (Addendum)
I spoke with Dr. Wynelle Link, ophthalmology on-call.  You are to follow-up with her tomorrow at 0830.  Use the Vigamox eyedrops as directed.  Cool compresses to your eye.  Try and keep your eye closed as much as possible.  You can use refrigerated Systane lubricating eyedrops or Lacri-Lube to help with the pain.  Go to the ER for worsening of your vision, redness and swelling around your eye, headache, fever above 100.4, or for other concerns.

## 2023-01-01 NOTE — ED Notes (Signed)
Medications given by Dr. Chaney Malling, no labels to scan

## 2023-01-01 NOTE — ED Triage Notes (Signed)
Patient presents to [facility] evaluation of right eye swelling.  States she has a hx of corneal abrasion and it was "cleared".  Patient c/o blurry vision in the right eye.

## 2023-01-01 NOTE — ED Provider Notes (Signed)
HPI  SUBJECTIVE:  Emily Clay is a 52 y.o. female who presents with right eye pain, photophobia, conjunctival injection, tearing, pain with extraocular movements starting today.  She states that she woke up without any symptoms, but later on she noticed cloudy vision.  This was followed by eye pain, foreign body sensation, increased tearing.  She states that she cannot open her eye due to the pain.  No blurry vision, double vision, visual loss.  No purulent discharge, headache, fevers, halos around lights, eye pain worse in the dark.  She denies rubbing her eye, chemical exposure or trauma.  She states this feels identical to her previous corneal abrasion that she sustained in February 2024, which healed.  She does not wear glasses or contacts.  No periorbital swelling, redness.  She tried ketorolac eyedrops with improvement in her symptoms.  Symptoms also better with keeping her eyes closed.  Symptoms worse when she tries to open her eye.  She has a past medical history of diabetes, hypertension, hypercholesterolemia.  No history of chronic kidney disease, glaucoma, iritis.    Past Medical History:  Diagnosis Date   Diabetes mellitus    Hypertension    Proteinuria     Past Surgical History:  Procedure Laterality Date   CESAREAN SECTION      Family History  Problem Relation Age of Onset   Diabetes Mother    Hypertension Mother    Cancer Mother    Diabetes Sister    Hypertension Sister    Hypertension Brother    Diabetes Maternal Aunt     Social History   Tobacco Use   Smoking status: Former    Types: Cigarettes   Smokeless tobacco: Never  Vaping Use   Vaping Use: Never used  Substance Use Topics   Alcohol use: Yes    Alcohol/week: 4.0 standard drinks of alcohol    Types: 4 Cans of beer per week   Drug use: No    No current facility-administered medications for this encounter.  Current Outpatient Medications:    Accu-Chek Softclix Lancets lancets, Use to check blood  sugar 3 times daily. E11.9, Disp: 100 each, Rfl: 6   atorvastatin (LIPITOR) 10 MG tablet, Take 1 tablet (10 mg total) by mouth once daily., Disp: 90 tablet, Rfl: 1   Blood Glucose Monitoring Suppl (ACCU-CHEK GUIDE) w/Device KIT, Use to check blood sugar 3 times daily. E11.9, Disp: 1 kit, Rfl: 0   Blood Pressure Monitoring (BLOOD PRESSURE CUFF) MISC, Take BP 1-2 times daily, write numbers down in a book and bring to your primary care doctor appointment., Disp: 1 each, Rfl: 0   glucose blood (ACCU-CHEK GUIDE) test strip, Use to check blood sugar 3 times daily. E11.9, Disp: 100 each, Rfl: 6   glucose blood (TRUE METRIX BLOOD GLUCOSE TEST) test strip, Use 1 strip to check blood sugar 3 times daily ., Disp: 100 each, Rfl: 2   Insulin Glargine Solostar (LANTUS) 100 UNIT/ML Solostar Pen, Inject 50 Units into the skin daily., Disp: 15 mL, Rfl: 0   insulin lispro (HUMALOG KWIKPEN) 100 UNIT/ML KwikPen, Inject 8 Units into the skin 3 (three) times daily with meals., Disp: 6 mL, Rfl: 3   Insulin Pen Needle (TRUEPLUS PEN NEEDLES) 31G X 8 MM MISC, USE AS DIRECTED, Disp: 100 each, Rfl: 6   meloxicam (MOBIC) 15 MG tablet, Take 1 tablet (15 mg total) by mouth daily., Disp: 30 tablet, Rfl: 0   metFORMIN (GLUCOPHAGE) 1000 MG tablet, Take 1 tablet (1,000  mg total) by mouth 2 (two) times daily with a meal., Disp: 60 tablet, Rfl: 6   moxifloxacin (VIGAMOX) 0.5 % ophthalmic solution, Place 1 drop into the right eye 3 (three) times daily. X 7 days, Disp: 3 mL, Rfl: 0   olmesartan-hydrochlorothiazide (BENICAR HCT) 40-12.5 MG tablet, Take 1 tablet by mouth daily., Disp: 90 tablet, Rfl: 0  No Known Allergies   ROS  As noted in HPI.   Physical Exam  BP (!) 185/111 (BP Location: Right Arm)   Pulse 85   Temp 98.1 F (36.7 C) (Oral)   Resp 18   LMP  (LMP Unknown)   SpO2 96%   Constitutional: Well developed, well nourished, appears to be in pain Eyes:  EOMI, PERRLA.  No direct or consensual photophobia.  Right eye  squeezed shut.  Positive tearing.  Positive conjunctival injection.  No foreign body seen on lid eversion.  No periorbital erythema, edema, tenderness.  Pain with EOMs.  No proptosis.  Large corneal abrasion crossing the center to the 9 o'clock position.  Negative Seidel.  Patient able to open eye immediately after tetracaine       Visual Acuity  Right Eye Distance:   Left Eye Distance:   Bilateral Distance:    Right Eye Near: R Near: 20/100 Left Eye Near:  L Near: 20/70 Bilateral Near:  20/100 (Patient barely able to open right eye to help her see)  HENT: Normocephalic, atraumatic,mucus membranes moist Respiratory: Normal inspiratory effort Cardiovascular: Normal rate GI: nondistended skin: No rash, skin intact Musculoskeletal: no deformities Neurologic: Alert & oriented x 3, no focal neuro deficits Psychiatric: Speech and behavior appropriate   ED Course   Medications - No data to display   No orders of the defined types were placed in this encounter.   No results found for this or any previous visit (from the past 24 hour(s)). No results found.  ED Clinical Impression  1. Abrasion of right cornea, initial encounter      ED Assessment/Plan     Patient with large central corneal abrasion.  Symptoms resolved after using the Tetracaine. discussed case with Dr. Wynelle Link, ophthalmology on-call.  Recommends Vigamox eyedrops, cold artificial tears, she is to keep her eye closed.  She does not recommend ketorolac eyedrops.  She is to follow-up with her tomorrow morning, show up at the office at 830.  Discussed MDM, treatment plan, and plan for follow-up with patient. Discussed sn/sx that should prompt return to the ED. patient agrees with plan.   Meds ordered this encounter  Medications   DISCONTD: tetracaine (PONTOCAINE) 0.5 % ophthalmic solution 1-2 drop   DISCONTD: fluorescein ophthalmic strip 1 strip   DISCONTD: moxifloxacin (VIGAMOX) 0.5 % ophthalmic solution    Sig:  Place 1 drop into both eyes 3 (three) times daily. X 7 days    Dispense:  3 mL    Refill:  0   moxifloxacin (VIGAMOX) 0.5 % ophthalmic solution    Sig: Place 1 drop into the right eye 3 (three) times daily. X 7 days    Dispense:  3 mL    Refill:  0      *This clinic note was created using Scientist, clinical (histocompatibility and immunogenetics). Therefore, there may be occasional mistakes despite careful proofreading.  ?    Domenick Gong, MD 01/02/23 1203

## 2023-01-05 ENCOUNTER — Telehealth: Payer: Self-pay

## 2023-01-05 NOTE — Telephone Encounter (Addendum)
Patient attempted to be outreached by Sharman Cheek, PharmD Candidate on 01/05/23 to discuss hypertension. Left voicemail for patient to return our call at their convenience at [phone].  [facility], [place]

## 2023-01-06 ENCOUNTER — Other Ambulatory Visit: Payer: Self-pay

## 2023-01-06 ENCOUNTER — Ambulatory Visit: Payer: Medicaid Other | Attending: Internal Medicine | Admitting: Internal Medicine

## 2023-01-06 ENCOUNTER — Encounter: Payer: Self-pay | Admitting: Internal Medicine

## 2023-01-06 VITALS — BP 152/72 | HR 74 | Temp 98.3°F | Ht 61.0 in | Wt 188.0 lb

## 2023-01-06 DIAGNOSIS — K0889 Other specified disorders of teeth and supporting structures: Secondary | ICD-10-CM

## 2023-01-06 DIAGNOSIS — E785 Hyperlipidemia, unspecified: Secondary | ICD-10-CM

## 2023-01-06 DIAGNOSIS — E1169 Type 2 diabetes mellitus with other specified complication: Secondary | ICD-10-CM | POA: Diagnosis not present

## 2023-01-06 DIAGNOSIS — R140 Abdominal distension (gaseous): Secondary | ICD-10-CM

## 2023-01-06 DIAGNOSIS — Z7984 Long term (current) use of oral hypoglycemic drugs: Secondary | ICD-10-CM

## 2023-01-06 DIAGNOSIS — Z596 Low income: Secondary | ICD-10-CM

## 2023-01-06 DIAGNOSIS — E1159 Type 2 diabetes mellitus with other circulatory complications: Secondary | ICD-10-CM | POA: Diagnosis not present

## 2023-01-06 DIAGNOSIS — Z1211 Encounter for screening for malignant neoplasm of colon: Secondary | ICD-10-CM

## 2023-01-06 DIAGNOSIS — R109 Unspecified abdominal pain: Secondary | ICD-10-CM

## 2023-01-06 DIAGNOSIS — E11649 Type 2 diabetes mellitus with hypoglycemia without coma: Secondary | ICD-10-CM

## 2023-01-06 DIAGNOSIS — I152 Hypertension secondary to endocrine disorders: Secondary | ICD-10-CM

## 2023-01-06 DIAGNOSIS — Z794 Long term (current) use of insulin: Secondary | ICD-10-CM | POA: Diagnosis not present

## 2023-01-06 DIAGNOSIS — Z6835 Body mass index (BMI) 35.0-35.9, adult: Secondary | ICD-10-CM

## 2023-01-06 DIAGNOSIS — Z23 Encounter for immunization: Secondary | ICD-10-CM

## 2023-01-06 LAB — POCT GLYCOSYLATED HEMOGLOBIN (HGB A1C): HbA1c, POC (controlled diabetic range): 8.7 % — AB (ref 0.0–7.0)

## 2023-01-06 LAB — GLUCOSE, POCT (MANUAL RESULT ENTRY)
POC Glucose: 27 mg/dl — AB (ref 70–99)
POC Glucose: 57 mg/dl — AB (ref 70–99)
POC Glucose: 88 mg/dl (ref 70–99)

## 2023-01-06 MED ORDER — INSULIN GLARGINE SOLOSTAR 100 UNIT/ML ~~LOC~~ SOPN
45.0000 [IU] | PEN_INJECTOR | Freq: Every day | SUBCUTANEOUS | 1 refills | Status: DC
Start: 2023-01-06 — End: 2023-03-19
  Filled 2023-01-06 – 2023-01-16 (×4): qty 15, 33d supply, fill #0
  Filled 2023-02-16: qty 15, 33d supply, fill #1

## 2023-01-06 MED ORDER — AMOXICILLIN 500 MG PO CAPS
500.0000 mg | ORAL_CAPSULE | Freq: Three times a day (TID) | ORAL | 0 refills | Status: DC
  Filled 2023-01-06: qty 21, 7d supply, fill #0

## 2023-01-06 MED ORDER — FREESTYLE LIBRE 3 READER DEVI
0 refills | Status: DC
Start: 2023-01-06 — End: 2023-01-26
  Filled 2023-01-06 – 2023-01-12 (×3): qty 1, 30d supply, fill #0
  Filled 2023-01-18: qty 1, fill #0
  Filled 2023-01-20: qty 1, 30d supply, fill #0
  Filled 2023-01-21: qty 1, 1d supply, fill #0
  Filled 2023-01-25: qty 1, 30d supply, fill #0

## 2023-01-06 MED ORDER — FREESTYLE LIBRE 3 SENSOR MISC
6 refills | Status: AC
Start: 2023-01-06 — End: ?
  Filled 2023-01-06: qty 2, 28d supply, fill #0
  Filled 2023-01-25 – 2023-01-28 (×2): qty 2, 28d supply, fill #1
  Filled 2023-02-27 – 2023-03-07 (×2): qty 2, 28d supply, fill #2
  Filled 2023-04-04: qty 2, 28d supply, fill #3
  Filled 2023-05-04: qty 2, 28d supply, fill #4
  Filled 2023-06-04: qty 2, 28d supply, fill #5
  Filled 2023-07-02: qty 2, 28d supply, fill #6

## 2023-01-06 MED ORDER — AMLODIPINE BESYLATE 5 MG PO TABS
5.0000 mg | ORAL_TABLET | Freq: Every day | ORAL | 1 refills | Status: DC
Start: 2023-01-06 — End: 2023-07-01
  Filled 2023-01-06: qty 90, 90d supply, fill #0
  Filled 2023-04-02: qty 90, 90d supply, fill #1

## 2023-01-06 MED ORDER — INSULIN LISPRO (1 UNIT DIAL) 100 UNIT/ML (KWIKPEN)
3.0000 [IU] | PEN_INJECTOR | Freq: Three times a day (TID) | SUBCUTANEOUS | 3 refills | Status: DC
Start: 2023-01-06 — End: 2023-07-10
  Filled 2023-01-06: qty 6, 56d supply, fill #0
  Filled 2023-02-27: qty 6, 56d supply, fill #1
  Filled 2023-05-04: qty 6, 56d supply, fill #2
  Filled 2023-07-02: qty 6, 56d supply, fill #3

## 2023-01-06 NOTE — Progress Notes (Unsigned)
Patient ID: Emily Clay, female    DOB: 11-11-1970  MRN: [id number]  CC: Diabetes (DM f/u. Dominica Severin, /Daily pain on L flank radiating to L back.  [place]on Axe on L side of gums.)   Subjective: Emily Clay is a 52 y.o. female who presents for chronic ds management Her concerns today include:  Patient with history of former tob dep, diabetes type 2, HTN, obesity   DM: Results for orders placed or performed in visit on 01/06/23  POCT glucose (manual entry)  Result Value Ref Range   POC Glucose 27 (A) 70 - 99 mg/dl  POCT glycosylated hemoglobin (Hb A1C)  Result Value Ref Range   Hemoglobin A1C     HbA1c POC (<> result, manual entry)     HbA1c, POC (prediabetic range)     HbA1c, POC (controlled diabetic range) 8.7 (A) 0.0 - 7.0 %  BS low at 27.  Pt did not feel BS low.  Given 2 small juices here in the office.  Ate lunch around 1 p.m today.  Reports she can sometimes can tell when BS low but not all the time. -Does not check BS regularly and does not have log. Checks BS 3-4x/wk in the mornings.  Gives range 140-over 200.  Eats dinner late around 8 p.m. -Confirms taking Lantus 50 units daily, Humalog 8 units TID and Metformin 1 gram BID -thinks she does okay with eating habits.  Snacks on almonds and peanut butter crackers and fruits.  Constantly on feet all day.  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  HTN: on Benicar/HCTZ 40/12.5 mg daily and took already for today.  Tries to limit salt in the foods.  Uses more garlic and seasoning salt  HL:  on Lipitor 10 mg daily.  LDL 3 mths ago was 70.  Thinks she has abscess forming in LT upper jaw.  Painful and swelling of gum since yesterday.  Not aware of cavity in the area.    C/o intermittent pain in LT flank x about 1 mth Not sure how long it last but thinks hrs.  Just a sore pain.  Worse with movement.  No radiation.  No dysuria or hematuria.  Some pain in middle of lumbar region as well at times that is also worse with  movements and does not radiate Takes Tylenol several days a wk   Complains of feeling of abdominal bloating and enlargement.  This has been present for a few months.  Denies any vomiting but sometimes nausea.  No diarrhea, constipation or increased flatulence.  Weight is increased by 14 pounds since March of this year.  HM: due for colon CA screen; no fhx of colon CA.  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 E11.9 100 each 6   atorvastatin (LIPITOR) 10 MG tablet Take 1 tablet (10 mg total) by mouth once daily. 90 tablet 1   Blood Glucose Monitoring Suppl (ACCU-CHEK GUIDE) w/Device KIT Use to check blood sugar 3 times daily. E11.9 1 kit 0   Blood Pressure Monitoring (BLOOD PRESSURE  CUFF) MISC Take BP 1-2 times daily, write numbers down in a book and bring to your primary care doctor appointment. 1 each 0   glucose blood (ACCU-CHEK GUIDE) test strip Use to check blood sugar 3 times daily. E11.9 100 each 6   glucose blood (TRUE METRIX BLOOD GLUCOSE TEST) test strip Use 1 strip to check blood sugar 3 times daily . 100 each 2   Insulin Pen Needle (TRUEPLUS PEN NEEDLES) 31G X 8 MM MISC USE AS DIRECTED 100 each 6   meloxicam (MOBIC) 15 MG tablet Take 1 tablet (15 mg total) by mouth daily. 30 tablet 0   metFORMIN (GLUCOPHAGE) 1000 MG tablet Take 1 tablet (1,000 mg total) by mouth 2 (two) times daily with a meal. 60 tablet 6   moxifloxacin (VIGAMOX) 0.5 % ophthalmic solution Place 1 drop into the right eye 3 (three) times daily. X 7 days 3 mL 0    olmesartan-hydrochlorothiazide (BENICAR HCT) 40-12.5 MG tablet Take 1 tablet by mouth daily. 90 tablet 0   No current facility-administered medications on file prior to visit.    No Known Allergies  Social History   Socioeconomic History   Marital status: Single    Spouse name: Not on file   Number of children: 1   Years of education: Not on file   Highest education level: 12th grade  Occupational History   Not on file  Tobacco Use   Smoking status: Former    Types: Cigarettes   Smokeless tobacco: Never  Vaping Use   Vaping Use: Never used  Substance and Sexual Activity   Alcohol use: Yes    Alcohol/week: 4.0 standard drinks of alcohol    Types: 4 Cans of beer per week   Drug use: No   Sexual activity: Yes  Other Topics Concern   Not on file  Social History Narrative   Not on file   Social Determinants of Health   Financial Resource Strain: High Risk (12/07/2022)   Overall Financial Resource Strain (CARDIA)    Difficulty of Paying Living Expenses: Hard  Food Insecurity: Food Insecurity Present (12/07/2022)   Hunger Vital Sign    Worried About Running Out of Food in the Last Year: Sometimes true    Ran Out of Food in the Last Year: Sometimes true  Transportation Needs: No Transportation Needs (12/07/2022)   PRAPARE - Administrator, Civil Service (Medical): No    Lack of Transportation (Non-Medical): No  Physical Activity: Sufficiently Active (12/07/2022)   Exercise Vital Sign    Days of Exercise per Week: 4 days    Minutes of Exercise per Session: 120 min  Recent Concern: Physical Activity - Inactive (11/10/2022)   Exercise Vital Sign    Days of Exercise per Week: 0 days    Minutes of Exercise per Session: 0 min  Stress: Stress Concern Present (12/07/2022)   [employer] of Occupational Health - Occupational Stress Questionnaire    Feeling of Stress : Rather much  Social Connections: Moderately Integrated (12/07/2022)   Social Connection and  Isolation Panel [NHANES]    Frequency of Communication with Friends and Family: More than three times a week    Frequency of Social Gatherings with Friends and Family: More than three times a week    Attends Religious Services: 1 to 4 times per year    Active Member of [employer] or Organizations: No    Attends Banker Meetings: Never    Marital Status: Living with partner  Recent Concern: Social Connections - Moderately Isolated (11/10/2022)   Social Connection and Isolation Panel [NHANES]    Frequency of Communication with Friends and Family: Three times a week    Frequency of Social Gatherings with Friends and Family: Three times a week    Attends Religious Services: 1 to 4 times per year    Active Member of Clubs or Organizations: No    Attends Banker Meetings: Never    Marital Status: Never married  Intimate Partner Violence: Not At Risk (11/10/2022)   Humiliation, Afraid, Rape, and Kick questionnaire    Fear of Current or Ex-Partner: No    Emotionally Abused: No    Physically Abused: No    Sexually Abused: No    Family History  Problem Relation Age of Onset   Diabetes Mother    Hypertension Mother    Cancer Mother    Diabetes Sister    Hypertension Sister    Hypertension Brother    Diabetes Maternal Aunt     Past Surgical History:  Procedure Laterality Date   CESAREAN SECTION      ROS: Review of Systems Negative except as stated above  PHYSICAL EXAM: BP (!) 152/72 (BP Location: Left Arm, Patient Position: Sitting, Cuff Size: Normal)   Pulse 74   Temp 98.3 F (36.8 C) (Oral)   Ht 5\' 1"  (1.549 m)   Wt 188 lb (85.3 kg)   LMP 10/27/2022 (Approximate)   SpO2 98%   BMI 35.52 kg/m   Wt Readings from Last 3 Encounters:  01/06/23 188 lb (85.3 kg)  12/25/22 174 lb (78.9 kg)  10/02/22 174 lb (78.9 kg)    Physical Exam  General appearance - alert, well appearing, middle age and in no distress Mental status - normal mood, behavior, speech,  dress, motor activity, and thought processes Neck - supple, no significant adenopathy Mouth: No swelling of the cheeks noted.  No obvious cavities noted in the teeth in the left upper jaw.  No obvious abscess noted.  She has mild tenderness along the outer gumline in the left upper jaw. Chest - clear to auscultation, no wheezes, rales or rhonchi, symmetric air entry Heart - normal rate, regular rhythm, normal S1, S2, no murmurs, rubs, clicks or gallops Abdomen: Protuberant.  Normal bowel sounds, nontender, no fluid wave appreciated.  No organomegaly.  Some stria on the lower abdomen no wall Extremities - peripheral pulses normal, no pedal edema, no clubbing or cyanosis MSK: No tenderness on palpation of the left flank and thoracic spine.  Mild tenderness on palpation of the lumbar spine.  Straight leg raise negative on both sides.     Latest Ref Rng & Units 10/02/2022    4:50 PM 10/16/2020    3:53 PM 01/27/2020    3:10 PM  CMP  Glucose 70 - 99 mg/dL 161  096  045   BUN 6 - 24 mg/dL 16  15  17    Creatinine 0.57 - 1.00 mg/dL 4.09  8.11  9.14   Sodium 134 - 144 mmol/L 141  136  137   Potassium 3.5 - 5.2 mmol/L 4.3  4.5  4.4   Chloride 96 - 106 mmol/L 104  97  101   CO2 20 - 29 mmol/L 21  21  23    Calcium 8.7 - 10.2 mg/dL 9.5  9.5  9.6   Total Protein 6.0 - 8.5 g/dL 7.0  7.1  7.1   Total Bilirubin 0.0 - 1.2 mg/dL 0.2  0.3  0.2   Alkaline Phos 44 - 121 IU/L 56  90  71   AST 0 - 40 IU/L 21  27  21    ALT 0 - 32 IU/L 22  27  21     Lipid Panel     Component Value Date/Time   CHOL 155 10/02/2022 1650   TRIG 49 10/02/2022 1650   HDL 74 10/02/2022 1650   CHOLHDL 2.1 10/02/2022 1650   CHOLHDL 3.0 07/25/2013 1246   VLDL 9 07/25/2013 1246   LDLCALC 70 10/02/2022 1650    CBC    Component Value Date/Time   WBC 6.2 10/02/2022 1650   WBC 5.0 07/25/2013 1246   RBC 4.18 10/02/2022 1650   RBC 4.28 07/25/2013 1246   HGB 13.0 10/02/2022 1650   HCT 38.6 10/02/2022 1650   PLT 323 10/02/2022 1650    MCV 92 10/02/2022 1650   MCH 31.1 10/02/2022 1650   MCH 30.8 07/25/2013 1246   MCHC 33.7 10/02/2022 1650   MCHC 33.7 07/25/2013 1246   RDW 12.2 10/02/2022 1650   LYMPHSABS 1.5 10/02/2022 1650   MONOABS 0.3 07/25/2013 1246   EOSABS 0.1 10/02/2022 1650   BASOSABS 0.1 10/02/2022 1650   Results for orders placed or performed in visit on 01/06/23  POCT glucose (manual entry)  Result Value Ref Range   POC Glucose 27 (A) 70 - 99 mg/dl  POCT glycosylated hemoglobin (Hb A1C)  Result Value Ref Range   Hemoglobin A1C     HbA1c POC (<> result, manual entry)     HbA1c, POC (prediabetic range)     HbA1c, POC (controlled diabetic range) 8.7 (A) 0.0 - 7.0 %    ASSESSMENT AND PLAN:  1. 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  Blood sugar today when she presented to the office was 27.  After 2 small apple juices, she had increased to 47.  We ended up having to give 3 glucose tablets, 2 packs of small Oreo cookies and a small can of soda before blood sugar came up to 75. BS at 88 before we allowed her to leave. I recommend decreasing Humalog from 8 units with meals to 3 units with meals and decreasing the Lantus from 50 units daily to 45 units daily. -She would benefit from having a continuous glucose monitor.  I went over the benefits of this with her and she was agreeable to having prescription sent to the pharmacy. We will do a short-term follow-up in 6 weeks. - POCT glucose (manual entry) - POCT glycosylated hemoglobin (Hb A1C) - Continuous Glucose Sensor (FREESTYLE LIBRE 3 SENSOR) MISC; Place 1 sensor on the skin every 14 days. Use to check glucose continuously  Dispense: 2 each; Refill: 6 - Ambulatory referral to Ophthalmology - Microalbumin / creatinine urine ratio - Continuous Glucose Receiver (FREESTYLE LIBRE READER) DEVI; Use as directed to read blood  sugars  Dispense: 1 each; Refill: 0 - insulin lispro (HUMALOG  KWIKPEN) 100 UNIT/ML KwikPen; Inject 3 Units into the skin 3 (three) times daily with meals.  Dispense: 6 mL; Refill: 3 - Insulin Glargine Solostar (LANTUS) 100 UNIT/ML Solostar Pen; Inject 45 Units into the skin daily.  Dispense: 15 mL; Refill: 1  2. Class 2 severe obesity with serious comorbidity and body mass index (BMI) of 35.0 to 35.9 in adult, unspecified obesity type [facility]) Discussed on encourage healthy eating habits.  Printed information given.  3. Hypertension associated with diabetes (HCC) Not at goal.  Continue  Benicar/HCTZ 40/12.5 mg daily.  Add amlodipine 5 mg daily. - amLODipine (NORVASC) 5 MG tablet; Take 1 tablet (5 mg total) by mouth daily.  Dispense: 90 tablet; Refill: 1  4. Hyperlipidemia associated with type 2 diabetes mellitus (HCC) Continue Lipitor 10 mg daily.  5. Flank pain, acute Suspect this is musculoskeletal in nature.  I recommend using some Voltaren gel or IcyHot rub over-the-counter along with heating pad.  Try to avoid heavy lifting, excessive pushing or pulling. - Urinalysis, Routine w reflex microscopic  6. Pain, dental Prescription sent to the pharmacy for amoxicillin for 7 days.  7. Abdominal bloating Does not sound like gastric bloating is much as it sounds like increased abdominal girth.  We will observe this for now and recheck on next visit.  8. Need for Tdap vaccination We had plan to give this today but held off in doing so after we had such a difficult time in getting her blood sugars up.  9. Screening for colon cancer Discussed colon cancer screening methods.  Patient agreeable to Cologuard test. - Cologuard    Patient was given the opportunity to ask questions.  Patient verbalized understanding of the plan and was able to repeat key elements of the plan.   This documentation was completed using Paediatric nurse.  Any transcriptional errors are unintentional.  Orders Placed This Encounter  Procedures   Cologuard    Microalbumin / creatinine urine ratio   Urinalysis, Routine w reflex microscopic   Ambulatory referral to Ophthalmology   POCT glucose (manual entry)   POCT glycosylated hemoglobin (Hb A1C)   POCT glucose (manual entry)     Requested Prescriptions   Signed Prescriptions Disp Refills   Continuous Glucose Sensor (FREESTYLE LIBRE 3 SENSOR) MISC 2 each 6    Sig: Place 1 sensor on the skin every 14 days. Use to check glucose continuously   amLODipine (NORVASC) 5 MG tablet 90 tablet 1    Sig: Take 1 tablet (5 mg total) by mouth daily.   Continuous Glucose Receiver (FREESTYLE LIBRE READER) DEVI 1 each 0    Sig: Use as directed to read blood  sugars   amoxicillin (AMOXIL) 500 MG capsule 21 capsule 0    Sig: Take 1 capsule (500 mg total) by mouth 3 (three) times daily.   insulin lispro (HUMALOG KWIKPEN) 100 UNIT/ML KwikPen 6 mL 3    Sig: Inject 3 Units into the skin 3 (three) times daily with meals.   Insulin Glargine Solostar (LANTUS) 100 UNIT/ML Solostar Pen 15 mL 1    Sig: Inject 45 Units into the skin daily.    Return in about 6 weeks (around 02/17/2023).  Jonah Blue, MD, FACP

## 2023-01-06 NOTE — Patient Instructions (Addendum)
Decrease Humalog insulin to 3 units with meals.  Decrease Lantus insulin from 50 units daily to 45 units daily.  I have sent a prescription to our pharmacy for a continuous glucose monitor as was discussed today.  Your blood pressure iis not at goal.  I have sent a prescription for a blood pressure medication called Amlodipine 5 mg for blood pressure.  Continue the Benicar/Hydrochlorothiazide as well.  Use Ice Hot Rub or Voltaren Gel from over the counter for flank pain.  Use of a heating pad will also help.  Avoid excess pushing/pulling.

## 2023-01-07 ENCOUNTER — Other Ambulatory Visit: Payer: Self-pay

## 2023-01-07 LAB — MICROSCOPIC EXAMINATION
Bacteria, UA: NONE SEEN
Casts: NONE SEEN /lpf
Epithelial Cells (non renal): NONE SEEN /hpf (ref 0–10)
RBC, Urine: NONE SEEN /hpf (ref 0–2)
WBC, UA: NONE SEEN /hpf (ref 0–5)

## 2023-01-07 LAB — URINALYSIS, ROUTINE W REFLEX MICROSCOPIC
Bilirubin, UA: NEGATIVE
Glucose, UA: NEGATIVE
Ketones, UA: NEGATIVE
Leukocytes,UA: NEGATIVE
Nitrite, UA: NEGATIVE
RBC, UA: NEGATIVE
Specific Gravity, UA: 1.019 (ref 1.005–1.030)
Urobilinogen, Ur: 0.2 mg/dL (ref 0.2–1.0)
pH, UA: 6.5 (ref 5.0–7.5)

## 2023-01-07 LAB — MICROALBUMIN / CREATININE URINE RATIO
Creatinine, Urine: 99.3 mg/dL
Microalb/Creat Ratio: 436 mg/g creat — ABNORMAL HIGH (ref 0–29)
Microalbumin, Urine: 432.8 ug/mL

## 2023-01-08 ENCOUNTER — Other Ambulatory Visit: Payer: Self-pay

## 2023-01-12 ENCOUNTER — Other Ambulatory Visit: Payer: Self-pay

## 2023-01-15 ENCOUNTER — Encounter: Payer: Self-pay | Admitting: Internal Medicine

## 2023-01-16 ENCOUNTER — Other Ambulatory Visit: Payer: Self-pay

## 2023-01-19 ENCOUNTER — Other Ambulatory Visit: Payer: Self-pay

## 2023-01-20 ENCOUNTER — Other Ambulatory Visit: Payer: Self-pay

## 2023-01-21 ENCOUNTER — Other Ambulatory Visit: Payer: Self-pay

## 2023-01-26 ENCOUNTER — Other Ambulatory Visit: Payer: Self-pay

## 2023-02-02 ENCOUNTER — Other Ambulatory Visit: Payer: Self-pay | Admitting: Internal Medicine

## 2023-02-02 ENCOUNTER — Other Ambulatory Visit: Payer: Self-pay

## 2023-02-02 MED ORDER — OLMESARTAN MEDOXOMIL-HCTZ 40-12.5 MG PO TABS
1.0000 | ORAL_TABLET | Freq: Every day | ORAL | 1 refills | Status: DC
  Filled 2023-02-02: qty 90, 90d supply, fill #0
  Filled 2023-03-07 – 2023-05-07 (×3): qty 90, 90d supply, fill #1

## 2023-02-03 ENCOUNTER — Other Ambulatory Visit: Payer: Self-pay

## 2023-02-04 ENCOUNTER — Other Ambulatory Visit: Payer: Self-pay

## 2023-02-16 ENCOUNTER — Other Ambulatory Visit: Payer: Self-pay

## 2023-02-27 ENCOUNTER — Telehealth: Payer: Medicaid Other | Admitting: Physician Assistant

## 2023-02-27 ENCOUNTER — Other Ambulatory Visit: Payer: Self-pay

## 2023-02-27 DIAGNOSIS — M545 Low back pain, unspecified: Secondary | ICD-10-CM

## 2023-02-27 DIAGNOSIS — R109 Unspecified abdominal pain: Secondary | ICD-10-CM

## 2023-02-27 DIAGNOSIS — N898 Other specified noninflammatory disorders of vagina: Secondary | ICD-10-CM

## 2023-02-27 NOTE — Progress Notes (Signed)
Because you are having vaginal discharge, but also having back pain and left sided pain, I feel your condition warrants further evaluation and I recommend that you be seen in a face to face visit.   NOTE: There will be NO CHARGE for this eVisit   If you are having a true medical emergency please call 911.      For an urgent face to face visit, [place] has eight urgent care centers for your convenience:   NEW!! [facility] Health Urgent Care Center at [facility] Get Driving Directions [phone] [address] [place], [address]    [facility] Center at [facility] Get Driving Directions [phone] [address] [place], [place] [address]   [facility] Health Urgent Care Center [facility]) Get Driving Directions [phone] [address] [place], [place] [address]  [facility] Health Urgent Care Center [facility]) Get Driving Directions [phone] [address] [place],  [place]  [address]  [facility] Health Urgent Care Center [facility] - at [employer]  [phone] [place],  [place] [address]   [facility] Health Urgent Care at [facility] Get Driving Directions [phone] [address] [place], [place] [address]   [facility] Health Urgent Care at [facility] Get Driving Directions  [phone] [address] [place], [place] [address]   [facility] Health Urgent Care at [facility] Directions [phone] [address] [place], [place] [address]  Your MyChart E-visit questionnaire answers were reviewed by a board certified advanced clinical practitioner to complete your personal care plan based on your specific symptoms.  Thank you for using e-Visits.    I have spent 5 minutes in review of e-visit questionnaire, review and updating patient chart, medical decision making and response to patient.    Margaretann Loveless, PA-C

## 2023-03-06 ENCOUNTER — Other Ambulatory Visit: Payer: Self-pay

## 2023-03-09 ENCOUNTER — Other Ambulatory Visit: Payer: Self-pay

## 2023-03-11 ENCOUNTER — Other Ambulatory Visit: Payer: Self-pay

## 2023-03-12 ENCOUNTER — Other Ambulatory Visit: Payer: Self-pay

## 2023-03-19 ENCOUNTER — Other Ambulatory Visit: Payer: Self-pay

## 2023-03-19 ENCOUNTER — Other Ambulatory Visit: Payer: Self-pay | Admitting: Internal Medicine

## 2023-03-19 DIAGNOSIS — E11649 Type 2 diabetes mellitus with hypoglycemia without coma: Secondary | ICD-10-CM

## 2023-03-19 MED ORDER — INSULIN GLARGINE SOLOSTAR 100 UNIT/ML ~~LOC~~ SOPN
45.0000 [IU] | PEN_INJECTOR | Freq: Every day | SUBCUTANEOUS | 2 refills | Status: DC
Start: 2023-03-19 — End: 2023-07-10
  Filled 2023-03-19 – 2023-04-02 (×2): qty 15, 33d supply, fill #0
  Filled 2023-05-07: qty 15, 33d supply, fill #1
  Filled 2023-06-09: qty 15, 33d supply, fill #2

## 2023-03-20 ENCOUNTER — Other Ambulatory Visit (HOSPITAL_COMMUNITY): Payer: Self-pay

## 2023-03-20 ENCOUNTER — Other Ambulatory Visit: Payer: Self-pay

## 2023-03-22 ENCOUNTER — Ambulatory Visit (HOSPITAL_COMMUNITY)
Admission: EM | Admit: 2023-03-22 | Discharge: 2023-03-22 | Disposition: A | Discharge status: Home / Self Care | Payer: Medicaid Other | Attending: Family Medicine | Admitting: Family Medicine

## 2023-03-22 ENCOUNTER — Encounter (HOSPITAL_COMMUNITY): Payer: Self-pay

## 2023-03-22 DIAGNOSIS — S0501XA Injury of conjunctiva and corneal abrasion without foreign body, right eye, initial encounter: Secondary | ICD-10-CM | POA: Diagnosis not present

## 2023-03-22 MED ORDER — TETRACAINE HCL 0.5 % OP SOLN
OPHTHALMIC | Status: AC
  Filled 2023-03-22: qty 4

## 2023-03-22 MED ORDER — GENTAMICIN SULFATE 0.3 % OP SOLN: 2.0000 [drp] | Freq: Three times a day (TID) | OPHTHALMIC | 0 refills | Status: AC

## 2023-03-22 MED ORDER — FLUORESCEIN SODIUM 1 MG OP STRP
ORAL_STRIP | OPHTHALMIC | Status: AC
  Filled 2023-03-22: qty 2

## 2023-03-22 NOTE — Discharge Instructions (Signed)
There was a corneal abrasion in your right eye just under the pupil.  Put gentamicin eyedrops in the affected eye(s) 3 times daily for 5 days.  You can apply the tetracaine drops about twice daily as you needed for pain.  I do think it will help the pain to tape your right eye shut while you are at home and not having to drive.  Please follow-up with your primary care, and I think it would be a good idea for you to see an eye doctor  Do restart using new refresh eyedrops more consistently

## 2023-03-22 NOTE — ED Provider Notes (Signed)
[facility]    CSN: [id number] Arrival date & time: 03/22/23  1240      History   Chief Complaint Chief Complaint  Patient presents with   Eye Pain    HPI Emily Clay is a 52 y.o. female.    Eye Pain  Here for eye pain that she first noted this AM. States she has dry eye, and has not been using her refresh eye drops.   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      Home Medications    Prior to Admission medications   Medication Sig Start Date End Date Taking? Authorizing Provider  Accu-Chek Softclix Lancets lancets Use to check blood sugar 3 times daily. E11.9 10/03/22  Yes Marcine Matar, MD  amLODipine (NORVASC) 5 MG tablet Take 1 tablet (5 mg total) by mouth daily. 01/06/23  Yes Marcine Matar, MD  atorvastatin (LIPITOR) 10 MG tablet Take 1 tablet (10 mg total) by mouth once daily. 10/02/22  Yes Georgian Co M, PA-C  Blood Glucose Monitoring Suppl (ACCU-CHEK GUIDE) w/Device KIT Use to check blood sugar 3 times daily. E11.9 10/03/22  Yes Marcine Matar, MD  Blood Pressure Monitoring (BLOOD PRESSURE CUFF) MISC Take BP 1-2 times daily, write numbers down in a book and bring to your primary care doctor appointment. 07/10/22  Yes Stanhope, Donavan Burnet, FNP  Continuous Glucose Sensor (FREESTYLE LIBRE 3 SENSOR) MISC Place 1 sensor on the skin every 14 days. Use to check glucose continuously 01/06/23  Yes Marcine Matar, MD  gentamicin  (GARAMYCIN) 0.3 % ophthalmic solution Place 2 drops into the right eye 3 (three) times daily for 5 days. 03/22/23 03/27/23 Yes Channa Hazelett, Janace Aris, MD  glucose blood (ACCU-CHEK GUIDE) test strip Use to check blood sugar 3 times daily. E11.9 10/03/22  Yes Marcine Matar, MD  glucose blood (TRUE METRIX BLOOD GLUCOSE TEST) test strip Use 1 strip to check blood sugar 3 times daily . 10/02/22  Yes McClung, Angela M, PA-C  Insulin Glargine Solostar (LANTUS) 100 UNIT/ML Solostar Pen Inject 45 Units into the skin daily. 03/19/23  Yes Marcine Matar, MD  insulin lispro (HUMALOG KWIKPEN) 100 UNIT/ML KwikPen Inject 3 Units into the skin 3 (three) times daily with meals. 01/06/23  Yes Marcine Matar, MD  Insulin Pen Needle (TRUEPLUS PEN NEEDLES) 31G X 8 MM MISC USE AS DIRECTED 10/02/22  Yes McClung, Angela M, PA-C  meloxicam (MOBIC) 15 MG tablet Take 1 tablet (15 mg total) by mouth daily. 12/25/22  Yes Lamptey, Britta Mccreedy, MD  metFORMIN (GLUCOPHAGE) 1000 MG tablet Take 1 tablet (1,000 mg total) by mouth 2 (two) times daily with a meal. 10/02/22  Yes McClung, Angela M, PA-C  olmesartan-hydrochlorothiazide (BENICAR HCT) 40-12.5 MG tablet Take 1 tablet by mouth daily. 02/02/23  Yes Marcine Matar, MD    Family History Family History  Problem Relation Age of Onset   Diabetes Mother  Hypertension Mother    Cancer Mother    Diabetes Sister    Hypertension Sister    Hypertension Brother    Diabetes Maternal Aunt     Social History Social History   Tobacco Use   Smoking status: Former    Types: Cigarettes   Smokeless tobacco: Never  Vaping Use   Vaping status: Never Used  Substance Use Topics   Alcohol use: Yes    Alcohol/week: 4.0 standard drinks of alcohol    Types: 4 Cans of beer per week   Drug use: No     Allergies   Patient has no known allergies.   Review of Systems Review of Systems  Eyes:  Positive for pain.     Physical Exam Triage Vital Signs ED Triage Vitals [03/22/23  1400]  Encounter Vitals Group     BP (!) 174/86     Systolic BP Percentile      Diastolic BP Percentile      Pulse Rate 82     Resp 18     Temp 98.7 F (37.1 C)     Temp Source Oral     SpO2 94 %     Weight      Height      Head Circumference      Peak Flow      Pain Score      Pain Loc      Pain Education      Exclude from Growth Chart    No data found.  Updated Vital Signs BP (!) 174/86 (BP Location: Left Arm)   Pulse 82   Temp 98.7 F (37.1 C) (Oral)   Resp 18   SpO2 94%   Visual Acuity Right Eye Distance:   Left Eye Distance:   Bilateral Distance:    Right Eye Near:   Left Eye Near:    Bilateral Near:     Physical Exam Vitals reviewed.  Constitutional:      General: She is not in acute distress.    Appearance: She is not toxic-appearing.  HENT:     Mouth/Throat:     Mouth: Mucous membranes are moist.  Eyes:     Extraocular Movements: Extraocular movements intact.     Pupils: Pupils are equal, round, and reactive to light.     Comments: Right conjunctiva is injected.  She holds her eyes closed before the tetracaine is applied.  No swelling of the eyelids  Skin:    Coloration: Skin is not pale.  Neurological:     Mental Status: She is oriented to person, place, and time.  Psychiatric:        Behavior: Behavior normal.      UC Treatments / Results  Labs (all labs ordered are listed, but only abnormal results are displayed) Labs Reviewed - No data to display  EKG   Radiology No results found.  Procedures Procedures (including critical care time)  Medications Ordered in UC Medications - No data to display  Initial Impression / Assessment and Plan / UC Course  I have reviewed the triage vital signs and the nursing notes.  Pertinent labs & imaging results that were available during my care of the patient were reviewed by me and considered in my medical decision making (see chart for details).        Verbal consent is obtained, and  tetracaine is applied to the right eye.  Supplies some good relief for her pain.  Then fluorescein staining is done  and there is uptake in the horizontal linear distribution inferior to her right pupil.  It is about 5 mm in length.   Final Clinical Impressions(s) / UC Diagnoses   Final diagnoses:  Abrasion of right cornea, initial encounter     Discharge Instructions      There was a corneal abrasion in your right eye just under the pupil.  Put gentamicin eyedrops in the affected eye(s) 3 times daily for 5 days.  You can apply the tetracaine drops about twice daily as you needed for pain.  I do think it will help the pain to tape your right eye shut while you are at home and not having to drive.  Please follow-up with your primary care, and I think it would be a good idea for you to see an eye doctor  Do restart using new refresh eyedrops more consistently      ED Prescriptions     Medication Sig Dispense Auth. Provider   gentamicin (GARAMYCIN) 0.3 % ophthalmic solution Place 2 drops into the right eye 3 (three) times daily for 5 days. 5 mL Zenia Resides, MD      PDMP not reviewed this encounter.   Zenia Resides, MD 03/22/23 1434

## 2023-03-22 NOTE — ED Triage Notes (Signed)
Here for right eye pain and drainage x 3 days.

## 2023-03-23 ENCOUNTER — Other Ambulatory Visit (HOSPITAL_COMMUNITY): Payer: Self-pay

## 2023-03-24 ENCOUNTER — Other Ambulatory Visit: Payer: Self-pay

## 2023-03-27 ENCOUNTER — Other Ambulatory Visit: Payer: Self-pay

## 2023-04-02 ENCOUNTER — Other Ambulatory Visit: Payer: Self-pay

## 2023-04-06 ENCOUNTER — Other Ambulatory Visit: Payer: Self-pay

## 2023-04-06 ENCOUNTER — Other Ambulatory Visit: Payer: Self-pay | Admitting: Physician Assistant

## 2023-04-06 DIAGNOSIS — E1169 Type 2 diabetes mellitus with other specified complication: Secondary | ICD-10-CM

## 2023-04-06 MED ORDER — ATORVASTATIN CALCIUM 10 MG PO TABS
10.0000 mg | ORAL_TABLET | Freq: Every day | ORAL | 1 refills | Status: DC
Start: 2023-04-06 — End: 2023-08-06
  Filled 2023-04-06: qty 90, 90d supply, fill #0
  Filled 2023-07-05: qty 90, 90d supply, fill #1

## 2023-04-08 ENCOUNTER — Other Ambulatory Visit: Payer: Self-pay

## 2023-04-09 ENCOUNTER — Other Ambulatory Visit: Payer: Self-pay

## 2023-05-04 ENCOUNTER — Other Ambulatory Visit: Payer: Self-pay

## 2023-05-04 ENCOUNTER — Other Ambulatory Visit: Payer: Self-pay | Admitting: Physician Assistant

## 2023-05-04 DIAGNOSIS — E1169 Type 2 diabetes mellitus with other specified complication: Secondary | ICD-10-CM

## 2023-05-04 MED ORDER — METFORMIN HCL 1000 MG PO TABS
1000.0000 mg | ORAL_TABLET | Freq: Two times a day (BID) | ORAL | 0 refills | Status: DC
Start: 2023-05-04 — End: 2023-06-04
  Filled 2023-05-04: qty 60, 30d supply, fill #0

## 2023-05-05 ENCOUNTER — Other Ambulatory Visit: Payer: Self-pay

## 2023-05-07 ENCOUNTER — Other Ambulatory Visit: Payer: Self-pay

## 2023-05-12 ENCOUNTER — Other Ambulatory Visit: Payer: Self-pay

## 2023-05-15 ENCOUNTER — Other Ambulatory Visit: Payer: Self-pay

## 2023-06-04 ENCOUNTER — Other Ambulatory Visit: Payer: Self-pay | Admitting: Internal Medicine

## 2023-06-04 ENCOUNTER — Other Ambulatory Visit: Payer: Self-pay

## 2023-06-04 DIAGNOSIS — E1169 Type 2 diabetes mellitus with other specified complication: Secondary | ICD-10-CM

## 2023-06-04 DIAGNOSIS — E119 Type 2 diabetes mellitus without complications: Secondary | ICD-10-CM

## 2023-06-04 MED ORDER — METFORMIN HCL 1000 MG PO TABS
1000.0000 mg | ORAL_TABLET | Freq: Two times a day (BID) | ORAL | 0 refills | Status: DC
Start: 2023-06-04 — End: 2023-07-10
  Filled 2023-06-04: qty 60, 30d supply, fill #0

## 2023-06-04 MED ORDER — ACCU-CHEK GUIDE W/DEVICE KIT
PACK | 0 refills | Status: AC
Start: 2023-06-04 — End: ?
  Filled 2023-06-04: qty 1, 30d supply, fill #0

## 2023-06-09 ENCOUNTER — Other Ambulatory Visit: Payer: Self-pay

## 2023-06-11 ENCOUNTER — Other Ambulatory Visit: Payer: Self-pay

## 2023-07-01 ENCOUNTER — Other Ambulatory Visit: Payer: Self-pay | Admitting: Internal Medicine

## 2023-07-01 ENCOUNTER — Other Ambulatory Visit: Payer: Self-pay

## 2023-07-01 DIAGNOSIS — E1159 Type 2 diabetes mellitus with other circulatory complications: Secondary | ICD-10-CM

## 2023-07-01 MED ORDER — AMLODIPINE BESYLATE 5 MG PO TABS
5.0000 mg | ORAL_TABLET | Freq: Every day | ORAL | 0 refills | Status: DC
Start: 2023-07-01 — End: 2023-07-31
  Filled 2023-07-01 – 2023-07-03 (×2): qty 30, 30d supply, fill #0

## 2023-07-03 ENCOUNTER — Other Ambulatory Visit: Payer: Self-pay

## 2023-07-04 ENCOUNTER — Other Ambulatory Visit: Payer: Self-pay | Admitting: Internal Medicine

## 2023-07-04 DIAGNOSIS — E1169 Type 2 diabetes mellitus with other specified complication: Secondary | ICD-10-CM

## 2023-07-05 ENCOUNTER — Telehealth: Payer: Medicaid Other | Admitting: Family Medicine

## 2023-07-05 DIAGNOSIS — B3731 Acute candidiasis of vulva and vagina: Secondary | ICD-10-CM | POA: Diagnosis not present

## 2023-07-05 MED ORDER — FLUCONAZOLE 150 MG PO TABS: 150.0000 mg | ORAL_TABLET | Freq: Once | ORAL | 0 refills | Status: AC

## 2023-07-05 NOTE — Progress Notes (Signed)

## 2023-07-06 ENCOUNTER — Other Ambulatory Visit: Payer: Self-pay

## 2023-07-07 NOTE — Telephone Encounter (Signed)
Requested medication (s) are due for refill today: yes  Requested medication (s) are on the active medication list: yes  Last refill:  06/04/23 #60  Future visit scheduled: no  Notes to clinic:  Attempted to call pt but unable to LM, Sent pt MyChart message to call office to make appt   Requested Prescriptions  Pending Prescriptions Disp Refills   metFORMIN (GLUCOPHAGE) 1000 MG tablet 60 tablet 0    Sig: Take 1 tablet (1,000 mg total) by mouth 2 (two) times daily with a meal.     Endocrinology:  Diabetes - Biguanides Failed - 07/04/2023  1:48 PM      Failed - HBA1C is between 0 and 7.9 and within 180 days    HbA1c, POC (controlled diabetic range)  Date Value Ref Range Status  01/06/2023 8.7 (A) 0.0 - 7.0 % Final         Failed - B12 Level in normal range and within 720 days    No results found for: "VITAMINB12"       Passed - Cr in normal range and within 360 days    Creat  Date Value Ref Range Status  07/25/2013 0.70 0.50 - 1.10 mg/dL Final   Creatinine, Ser  Date Value Ref Range Status  10/02/2022 0.86 0.57 - 1.00 mg/dL Final         Passed - eGFR in normal range and within 360 days    GFR, Est African American  Date Value Ref Range Status  07/25/2013 >89 mL/min Final   GFR calc Af Amer  Date Value Ref Range Status  01/27/2020 92 >59 mL/min/1.73 Final    Comment:    **[facility] currently reports eGFR in compliance with the current**   recommendations of the [employer]. [facility] will   update reporting as new guidelines are published from the NKF-ASN   Task force.    GFR, Est Non African American  Date Value Ref Range Status  07/25/2013 >89 mL/min Final    Comment:      The estimated GFR is a calculation valid for adults (>=32 years old) that uses the CKD-EPI algorithm to adjust for age and sex. It is   not to be used for children, pregnant women, hospitalized patients,    patients on dialysis, or with rapidly changing kidney  function. According to the NKDEP, eGFR >89 is normal, 60-89 shows mild impairment, 30-59 shows moderate impairment, 15-29 shows severe impairment and <15 is ESRD.     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 [facility] Marcine Matar, MD   7 months ago Essential hypertension   [facility] [place]. [facility] Lois Huxley, [place] L, RPH-CPP   9 months ago Diabetes mellitus with macroalbuminuric diabetic nephropathy [facility])   [facility] [place]. [facility] [place], [place], [place]   1 year ago Diabetes mellitus type 2 in obese [facility])   [place] [facility] [place]. [facility] Yehuda Savannah L, RPH-CPP   1 year ago Diabetes mellitus type 2 in obese [facility])   [facility] . [facility] Marcine Matar, MD              Passed - CBC within normal limits and completed in the last 12 months    WBC  Date Value Ref Range Status  10/02/2022 6.2 3.4 - 10.8 x10E3/uL Final  07/25/2013 5.0 4.0 - 10.5 K/uL Final   RBC  Date Value Ref Range Status  10/02/2022 4.18 3.77 - 5.28 x10E6/uL Final  07/25/2013 4.28 3.87 - 5.11 MIL/uL Final   Hemoglobin  Date Value Ref Range Status  10/02/2022 13.0 11.1 - 15.9 g/dL Final   Hematocrit  Date Value Ref Range Status  10/02/2022 38.6 34.0 - 46.6 % Final   MCHC  Date Value Ref Range Status  10/02/2022 33.7 31.5 - 35.7 g/dL Final  19/14/7829 56.2 30.0 - 36.0 g/dL Final   [facility]  Date Value Ref Range Status  10/02/2022  31.1 26.6 - 33.0 pg Final  07/25/2013 30.8 26.0 - 34.0 pg Final   MCV  Date Value Ref Range Status  10/02/2022 92 79 - 97 fL Final   No results found for: "PLTCOUNTKUC", "LABPLAT", "POCPLA" RDW  Date Value Ref Range Status  10/02/2022 12.2 11.7 - 15.4 % Final

## 2023-07-09 ENCOUNTER — Other Ambulatory Visit: Payer: Self-pay

## 2023-07-10 ENCOUNTER — Other Ambulatory Visit: Payer: Self-pay

## 2023-07-10 ENCOUNTER — Other Ambulatory Visit: Payer: Self-pay | Admitting: Pharmacist

## 2023-07-10 ENCOUNTER — Other Ambulatory Visit: Payer: Self-pay | Admitting: Internal Medicine

## 2023-07-10 DIAGNOSIS — E11649 Type 2 diabetes mellitus with hypoglycemia without coma: Secondary | ICD-10-CM

## 2023-07-10 DIAGNOSIS — E1169 Type 2 diabetes mellitus with other specified complication: Secondary | ICD-10-CM

## 2023-07-10 MED ORDER — METFORMIN HCL 1000 MG PO TABS
1000.0000 mg | ORAL_TABLET | Freq: Two times a day (BID) | ORAL | 0 refills | Status: DC
Start: 2023-07-10 — End: 2023-08-06
  Filled 2023-07-10: qty 60, 30d supply, fill #0

## 2023-07-10 MED ORDER — INSULIN LISPRO (1 UNIT DIAL) 100 UNIT/ML (KWIKPEN)
3.0000 [IU] | PEN_INJECTOR | Freq: Three times a day (TID) | SUBCUTANEOUS | 0 refills | Status: DC
Start: 2023-07-10 — End: 2023-08-06
  Filled 2023-07-10: qty 3, 34d supply, fill #0

## 2023-07-10 MED ORDER — INSULIN GLARGINE SOLOSTAR 100 UNIT/ML ~~LOC~~ SOPN
45.0000 [IU] | PEN_INJECTOR | Freq: Every day | SUBCUTANEOUS | 0 refills | Status: DC
Start: 2023-07-10 — End: 2023-08-06
  Filled 2023-07-10: qty 15, 33d supply, fill #0

## 2023-07-16 ENCOUNTER — Other Ambulatory Visit: Payer: Self-pay

## 2023-07-31 ENCOUNTER — Other Ambulatory Visit: Payer: Self-pay | Admitting: Internal Medicine

## 2023-07-31 DIAGNOSIS — E1159 Type 2 diabetes mellitus with other circulatory complications: Secondary | ICD-10-CM

## 2023-08-02 MED ORDER — AMLODIPINE BESYLATE 5 MG PO TABS
5.0000 mg | ORAL_TABLET | Freq: Every day | ORAL | 0 refills | Status: DC
Start: 2023-08-02 — End: 2023-08-06
  Filled 2023-08-02: qty 30, 30d supply, fill #0

## 2023-08-03 ENCOUNTER — Other Ambulatory Visit: Payer: Self-pay | Admitting: Internal Medicine

## 2023-08-03 ENCOUNTER — Other Ambulatory Visit: Payer: Self-pay

## 2023-08-03 MED ORDER — OLMESARTAN MEDOXOMIL-HCTZ 40-12.5 MG PO TABS
1.0000 | ORAL_TABLET | Freq: Every day | ORAL | 0 refills | Status: DC
  Filled 2023-08-03: qty 7, 7d supply, fill #0

## 2023-08-06 ENCOUNTER — Other Ambulatory Visit: Payer: Self-pay

## 2023-08-06 ENCOUNTER — Encounter: Payer: Self-pay | Admitting: Physician Assistant

## 2023-08-06 ENCOUNTER — Ambulatory Visit: Payer: Medicaid Other | Attending: Physician Assistant | Admitting: Physician Assistant

## 2023-08-06 VITALS — BP 163/87 | HR 70 | Wt 190.6 lb

## 2023-08-06 DIAGNOSIS — E1169 Type 2 diabetes mellitus with other specified complication: Secondary | ICD-10-CM | POA: Diagnosis not present

## 2023-08-06 DIAGNOSIS — E1159 Type 2 diabetes mellitus with other circulatory complications: Secondary | ICD-10-CM | POA: Diagnosis not present

## 2023-08-06 DIAGNOSIS — Z794 Long term (current) use of insulin: Secondary | ICD-10-CM

## 2023-08-06 DIAGNOSIS — E1165 Type 2 diabetes mellitus with hyperglycemia: Secondary | ICD-10-CM | POA: Diagnosis not present

## 2023-08-06 DIAGNOSIS — M545 Low back pain, unspecified: Secondary | ICD-10-CM

## 2023-08-06 DIAGNOSIS — I152 Hypertension secondary to endocrine disorders: Secondary | ICD-10-CM

## 2023-08-06 DIAGNOSIS — E785 Hyperlipidemia, unspecified: Secondary | ICD-10-CM

## 2023-08-06 LAB — POCT GLYCOSYLATED HEMOGLOBIN (HGB A1C): HbA1c, POC (controlled diabetic range): 9.6 % — AB (ref 0.0–7.0)

## 2023-08-06 LAB — GLUCOSE, POCT (MANUAL RESULT ENTRY): POC Glucose: 303 mg/dL — AB (ref 70–99)

## 2023-08-06 MED ORDER — METFORMIN HCL 1000 MG PO TABS
1000.0000 mg | ORAL_TABLET | Freq: Two times a day (BID) | ORAL | 1 refills | Status: DC
Start: 2023-08-06 — End: 2023-12-08
  Filled 2023-08-06: qty 180, 90d supply, fill #0
  Filled 2023-11-08: qty 180, 90d supply, fill #1

## 2023-08-06 MED ORDER — OLMESARTAN MEDOXOMIL-HCTZ 40-12.5 MG PO TABS
1.0000 | ORAL_TABLET | Freq: Every day | ORAL | 1 refills | Status: DC
Start: 2023-08-06 — End: 2023-12-08
  Filled 2023-08-06: qty 90, 90d supply, fill #0
  Filled 2023-11-03: qty 90, 90d supply, fill #1

## 2023-08-06 MED ORDER — INSULIN GLARGINE SOLOSTAR 100 UNIT/ML ~~LOC~~ SOPN
30.0000 [IU] | PEN_INJECTOR | Freq: Two times a day (BID) | SUBCUTANEOUS | 3 refills | Status: DC
Start: 2023-08-06 — End: 2023-12-08
  Filled 2023-08-06: qty 15, 25d supply, fill #0
  Filled 2023-09-01 – 2023-09-22 (×2): qty 15, 25d supply, fill #1
  Filled 2023-10-15: qty 15, 25d supply, fill #2
  Filled 2023-11-15: qty 15, 25d supply, fill #3

## 2023-08-06 MED ORDER — INSULIN LISPRO (1 UNIT DIAL) 100 UNIT/ML (KWIKPEN)
3.0000 [IU] | PEN_INJECTOR | Freq: Three times a day (TID) | SUBCUTANEOUS | 0 refills | Status: DC
Start: 2023-08-06 — End: 2023-09-22
  Filled 2023-08-06: qty 3, 34d supply, fill #0
  Filled 2023-08-27: qty 3, 28d supply, fill #0

## 2023-08-06 MED ORDER — ATORVASTATIN CALCIUM 10 MG PO TABS
10.0000 mg | ORAL_TABLET | Freq: Every day | ORAL | 1 refills | Status: DC
Start: 2023-08-06 — End: 2024-03-29
  Filled 2023-08-06 – 2023-10-09 (×2): qty 90, 90d supply, fill #0
  Filled 2024-01-27: qty 90, 90d supply, fill #1

## 2023-08-06 MED ORDER — METHOCARBAMOL 500 MG PO TABS
1000.0000 mg | ORAL_TABLET | Freq: Three times a day (TID) | ORAL | 0 refills | Status: DC | PRN
Start: 2023-08-06 — End: 2024-03-29
  Filled 2023-08-06: qty 90, 15d supply, fill #0

## 2023-08-06 MED ORDER — AMLODIPINE BESYLATE 5 MG PO TABS
5.0000 mg | ORAL_TABLET | Freq: Every day | ORAL | 0 refills | Status: DC
Start: 2023-08-06 — End: 2023-10-29
  Filled 2023-08-06: qty 90, 90d supply, fill #0

## 2023-08-06 NOTE — Progress Notes (Signed)
 Patient ID: [name], female   DOB: 1971/07/16, 53 y.o.   MRN: [id number]   Emily Clay, is a 53 y.o. female  [id number]:[id number]  [id number]  DOB - 1971/02/27  Chief Complaint  Patient presents with   Medication Refill       Subjective:   Emily Clay is a 53 y.o. female here today for diabetes check and lower back pain.  She has not been taking her insulin  with meals.  She says she is compliant with other meds and taking 50 of lantus  currently.  Doesn't follow diabetic diet.    C/o low back pain for several months.  Pain is across B lower back.  No urinary s/sx.  NKI.  Usu takes tylenol  which helps some.  Occurs any time of day or night and almost daily.  She does think she may have gained some weight which has made it worse.  No radicular s/sx.    No problems updated.  ALLERGIES: No Known Allergies  PAST MEDICAL HISTORY: Past Medical History:  Diagnosis Date   Diabetes mellitus    Hypertension    Proteinuria     MEDICATIONS AT HOME: Prior to Admission medications   Medication Sig Start Date End Date Taking? Authorizing Provider  Accu-Chek Softclix Lancets lancets Use to check blood sugar 3 times daily. E11.9 10/03/22  Yes Vicci Barnie NOVAK, MD  Blood Glucose Monitoring Suppl (ACCU-CHEK GUIDE) w/Device KIT Use to check blood sugar 3 times daily. 06/04/23  Yes Vicci Barnie NOVAK, MD  Blood Pressure Monitoring (BLOOD PRESSURE CUFF) MISC Take BP 1-2 times daily, write numbers down in a book and bring to your primary care doctor appointment. 07/10/22  Yes Stanhope, Dorna CHRISTELLA, FNP  Continuous Glucose Sensor (FREESTYLE LIBRE 3 SENSOR) MISC Place 1 sensor on the skin every 14 days. Use to check glucose continuously 01/06/23  Yes Vicci Barnie NOVAK, MD  glucose blood (ACCU-CHEK GUIDE) test strip Use to check blood sugar 3 times daily. E11.9 10/03/22  Yes Vicci Barnie NOVAK, MD  glucose blood (TRUE METRIX BLOOD GLUCOSE TEST) test strip Use 1 strip to check blood sugar 3 times  daily . 10/02/22  Yes Erbie Arment M, PA-C  Insulin  Pen Needle (TRUEPLUS PEN NEEDLES) 31G X 8 MM MISC USE AS DIRECTED 10/02/22  Yes Larenzo Caples, Jon CHRISTELLA, PA-C  meloxicam  (MOBIC ) 15 MG tablet Take 1 tablet (15 mg total) by mouth daily. 12/25/22  Yes Lamptey, Aleene KIDD, MD  methocarbamol  (ROBAXIN ) 500 MG tablet Take 2 tablets (1,000 mg total) by mouth every 8 (eight) hours as needed. 08/06/23  Yes Danton Jon CHRISTELLA, PA-C  amLODipine  (NORVASC ) 5 MG tablet Take 1 tablet (5 mg total) by mouth daily. 08/06/23   Danton Jon CHRISTELLA, PA-C  atorvastatin  (LIPITOR) 10 MG tablet Take 1 tablet (10 mg total) by mouth once daily. 08/06/23   Danton Jon CHRISTELLA, PA-C  Insulin  Glargine Solostar (LANTUS ) 100 UNIT/ML Solostar Pen Inject 30 Units into the skin in the morning and at bedtime. 08/06/23   Markice Torbert M, PA-C  insulin  lispro (HUMALOG  KWIKPEN) 100 UNIT/ML KwikPen Inject 3 Units into the skin 3 (three) times daily with meals. 08/06/23   Alvina Strother M, PA-C  metFORMIN  (GLUCOPHAGE ) 1000 MG tablet Take 1 tablet (1,000 mg total) by mouth 2 (two) times daily with a meal. 08/06/23   Shakir Petrosino, Jon CHRISTELLA, PA-C  olmesartan -hydrochlorothiazide  (BENICAR  HCT) 40-12.5 MG tablet Take 1 tablet by mouth daily.Must have office visit for refills 08/06/23   Danton Jon CHRISTELLA, PA-C  ROS: Neg HEENT Neg resp Neg cardiac Neg GI Neg GU Neg psych Neg neuro  Objective:   Vitals:   08/06/23 1632  BP: (!) 163/87  Pulse: 70  SpO2: 100%  Weight: 190 lb 9.6 oz (86.5 kg)   Exam General appearance : Awake, alert, not in any distress. Speech Clear. Not toxic looking HEENT: Atraumatic and Normocephalic. Neck: Supple, no JVD. No cervical lymphadenopathy.  Chest: Good air entry bilaterally, CTAB.  No rales/rhonchi/wheezing CVS: S1 S2 regular, no murmurs.  Back:  ROM WNL.  Neg SLRB.  There is spasm in the B lumbar region.  B LEDTR=intact Extremities: B/L Lower Ext shows no edema, both legs are warm to touch Neurology: Awake alert, and  oriented X 3, CN II-XII intact, Non focal Skin: No Rash  Data Review Lab Results  Component Value Date   HGBA1C 9.6 (A) 08/06/2023   HGBA1C 8.7 (A) 01/06/2023   HGBA1C 9.5 (A) 10/02/2022    Assessment & Plan   1. Type 2 diabetes mellitus with hyperglycemia, with long-term current use of insulin  (HCC) (Primary) Uncontrolled-will increase and divide dose lantus .   Work at a goal of eliminating sugary drinks, candy, desserts, sweets, refined sugars, processed foods, and white carbohydrates.   - Glucose (CBG) - HgB A1c - insulin  lispro (HUMALOG  KWIKPEN) 100 UNIT/ML KwikPen; Inject 3 Units into the skin 3 (three) times daily with meals.  Dispense: 3 mL; Refill: 0 she will use as needed/SS - metFORMIN  (GLUCOPHAGE ) 1000 MG tablet; Take 1 tablet (1,000 mg total) by mouth 2 (two) times daily with a meal.  Dispense: 180 tablet; Refill: 1 - Insulin  Glargine Solostar (LANTUS ) 100 UNIT/ML Solostar Pen; Inject 30 Units into the skin in the morning and at bedtime.  Dispense: 15 mL; Refill: 3  2. Long term (current) use of insulin  (HCC)  3. Hypertension associated with diabetes (HCC) Uncontrolled bc out of meds several days - amLODipine  (NORVASC ) 5 MG tablet; Take 1 tablet (5 mg total) by mouth daily.  Dispense: 90 tablet; Refill: 0 - olmesartan -hydrochlorothiazide  (BENICAR  HCT) 40-12.5 MG tablet; Take 1 tablet by mouth daily.Must have office visit for refills  Dispense: 90 tablet; Refill: 1  4. Hyperlipidemia associated with type 2 diabetes mellitus (HCC) - atorvastatin  (LIPITOR) 10 MG tablet; Take 1 tablet (10 mg total) by mouth once daily.  Dispense: 90 tablet; Refill: 1 - Comprehensive metabolic panel - Lipid Panel  5. Acute bilateral low back pain without sciatica No red flags - DG Lumbar Spine 2-3 Views; Future - methocarbamol  (ROBAXIN ) 500 MG tablet; Take 2 tablets (1,000 mg total) by mouth every 8 (eight) hours as needed.  Dispense: 90 tablet; Refill: 0    Return for Luke-1 month for  DM the 3 months Dr Vicci.  The patient was given clear instructions to go to ER or return to medical center if symptoms don't improve, worsen or new problems develop. The patient verbalized understanding. The patient was told to call to get lab results if they haven't heard anything in the next week.      Jon Moores, PA-C [facility] [place], [place] [phone]   08/06/2023, 5:35 PM

## 2023-08-06 NOTE — Progress Notes (Deleted)
 190.6  Med refill   Pn fn

## 2023-08-06 NOTE — Patient Instructions (Signed)
 Check blood sugars fasting and at bedtime.  Drink 80 ounces water daily  Work at a goal of eliminating sugary drinks, candy, desserts, sweets, refined sugars, processed foods, and white carbohydrates.

## 2023-08-07 ENCOUNTER — Telehealth: Payer: Self-pay

## 2023-08-07 LAB — LIPID PANEL
Chol/HDL Ratio: 2 {ratio} (ref 0.0–4.4)
Cholesterol, Total: 151 mg/dL (ref 100–199)
HDL: 75 mg/dL (ref >39)
LDL Chol Calc (NIH): 62 mg/dL (ref 0–99)
Triglycerides: 69 mg/dL (ref 0–149)
VLDL Cholesterol Cal: 14 mg/dL (ref 5–40)

## 2023-08-07 LAB — COMPREHENSIVE METABOLIC PANEL
ALT: 21 [IU]/L (ref 0–32)
AST: 31 [IU]/L (ref 0–40)
Albumin: 4.5 g/dL (ref 3.9–4.9)
Alkaline Phosphatase: 66 [IU]/L (ref 44–121)
BUN/Creatinine Ratio: 16 (ref 9–23)
BUN: 12 mg/dL (ref 6–24)
Bilirubin Total: 0.3 mg/dL (ref 0.0–1.2)
CO2: 22 mmol/L (ref 20–29)
Calcium: 9.1 mg/dL (ref 8.7–10.2)
Chloride: 96 mmol/L (ref 96–106)
Creatinine, Ser: 0.74 mg/dL (ref 0.57–1.00)
Globulin, Total: 2.9 g/dL (ref 1.5–4.5)
Glucose: 279 mg/dL — ABNORMAL HIGH (ref 70–99)
Potassium: 5.4 mmol/L — ABNORMAL HIGH (ref 3.5–5.2)
Sodium: 135 mmol/L (ref 134–144)
Total Protein: 7.4 g/dL (ref 6.0–8.5)
eGFR: 99 mL/min/{1.73_m2} (ref >59)

## 2023-08-07 NOTE — Telephone Encounter (Signed)
 Pt was called and is aware of results, DOB was confirmed.  ?

## 2023-08-07 NOTE — Telephone Encounter (Signed)
-----   Message from Jon Moores sent at 08/07/2023  7:38 AM EST ----- Please call patient. Her potassium is a little elevated. If she is using anything over the counter to supplement potassium, she should stop it. Please tell her to make sure and resume and take all of her meds as well as drinking adequate water as we discussed. We will recheck potassium at her follow up in a few weeks with Herlene. Thanks, Jon Moores, PA-C

## 2023-08-12 ENCOUNTER — Other Ambulatory Visit: Payer: Self-pay

## 2023-08-13 ENCOUNTER — Other Ambulatory Visit: Payer: Self-pay

## 2023-08-27 ENCOUNTER — Other Ambulatory Visit: Payer: Self-pay

## 2023-08-28 ENCOUNTER — Other Ambulatory Visit: Payer: Self-pay

## 2023-09-01 ENCOUNTER — Other Ambulatory Visit: Payer: Self-pay

## 2023-09-07 ENCOUNTER — Telehealth: Payer: Self-pay

## 2023-09-07 NOTE — Telephone Encounter (Signed)
 Copied from CRM [id number]. Topic: Referral - Question >> Sep 07, 2023  9:36 AM Pattie Borders B wrote: Reason for CRM: Patient states has history of back pain and PCP previously advised her to contact office for referral for Orthopedics. [phone]

## 2023-09-08 ENCOUNTER — Ambulatory Visit: Payer: Medicaid Other | Admitting: Pharmacist

## 2023-09-08 NOTE — Telephone Encounter (Signed)
Noted! Thank you

## 2023-09-08 NOTE — Telephone Encounter (Signed)
Called LVM to call back

## 2023-09-08 NOTE — Telephone Encounter (Signed)
Let patient know that she needs to go to [facility] imaging and get the x-rays done on the lower back as was ordered when she saw our physician assistant last month.  We will need to have that done first to determine next step.

## 2023-09-08 NOTE — Telephone Encounter (Signed)
Pt called back and given Dr Laural Benes message verbatim.  Pt verbalized understanding.  I went over location, and times they are open.  Pt was very grateful for this information, and will probably go today for xrays.  Nothing further needed.

## 2023-09-10 ENCOUNTER — Other Ambulatory Visit: Payer: Self-pay

## 2023-09-14 NOTE — Telephone Encounter (Signed)
Copied from CRM [id number]. Topic: Appointments - Scheduling Inquiry for Clinic >> Sep 14, 2023 10:01 AM Phill Myron wrote: AttnFranky Macho  Patient calling to see if she needs to come in for a potassium check, since it was elevated at the last visit

## 2023-09-21 ENCOUNTER — Other Ambulatory Visit: Payer: Medicaid Other

## 2023-09-22 ENCOUNTER — Other Ambulatory Visit: Payer: Self-pay | Admitting: Physician Assistant

## 2023-09-22 ENCOUNTER — Other Ambulatory Visit: Payer: Self-pay

## 2023-09-22 DIAGNOSIS — E1165 Type 2 diabetes mellitus with hyperglycemia: Secondary | ICD-10-CM

## 2023-09-23 ENCOUNTER — Other Ambulatory Visit: Payer: Self-pay

## 2023-09-23 MED ORDER — INSULIN LISPRO (1 UNIT DIAL) 100 UNIT/ML (KWIKPEN)
3.0000 [IU] | PEN_INJECTOR | Freq: Three times a day (TID) | SUBCUTANEOUS | 1 refills | Status: DC
Start: 2023-09-23 — End: 2024-03-04
  Filled 2023-09-23: qty 9, 84d supply, fill #0
  Filled 2023-12-13: qty 9, 84d supply, fill #1

## 2023-09-24 ENCOUNTER — Telehealth: Payer: Self-pay

## 2023-09-24 ENCOUNTER — Other Ambulatory Visit: Payer: Self-pay

## 2023-09-24 DIAGNOSIS — E1169 Type 2 diabetes mellitus with other specified complication: Secondary | ICD-10-CM

## 2023-09-24 NOTE — Telephone Encounter (Signed)
 Patient has been called and scheduled.    Copied from CRM [id number]. Topic: Clinical - Request for Lab/Test Order >> Sep 24, 2023  9:12 AM Elle L wrote: Reason for CRM: The patient is requesting a lab order for potassium. She would like to come in next week as it is her spring break. She is requesting a call back at [phone]

## 2023-09-25 ENCOUNTER — Other Ambulatory Visit: Payer: Self-pay

## 2023-09-29 ENCOUNTER — Ambulatory Visit: Payer: Medicaid Other | Attending: Internal Medicine

## 2023-09-29 DIAGNOSIS — E1169 Type 2 diabetes mellitus with other specified complication: Secondary | ICD-10-CM

## 2023-09-30 ENCOUNTER — Encounter: Payer: Self-pay | Admitting: Physician Assistant

## 2023-09-30 LAB — BASIC METABOLIC PANEL
BUN/Creatinine Ratio: 16 (ref 9–23)
BUN: 16 mg/dL (ref 6–24)
CO2: 21 mmol/L (ref 20–29)
Calcium: 9.6 mg/dL (ref 8.7–10.2)
Chloride: 103 mmol/L (ref 96–106)
Creatinine, Ser: 1 mg/dL (ref 0.57–1.00)
Glucose: 371 mg/dL — ABNORMAL HIGH (ref 70–99)
Potassium: 4.7 mmol/L (ref 3.5–5.2)
Sodium: 140 mmol/L (ref 134–144)
eGFR: 69 mL/min/{1.73_m2} (ref >59)

## 2023-10-09 ENCOUNTER — Other Ambulatory Visit: Payer: Self-pay

## 2023-10-11 NOTE — Progress Notes (Deleted)
 S:     No chief complaint on file.  Emily Clay is a 53 y.o. female who presents for diabetes evaluation, education, and management.  PMH is significant for HTN, T2DM w/ nephropathy, HLD.  Patient was referred by Primary Care Provider, Dr. Laural Benes, on 03/25/2022. Last seen by Georgian Co, PA on 08/06/23 - patient reported not taking insulin with meals. BP elevated to 163/87 in the setting of running out of medications. Increase insulin glargine to 30 units twice daily.   Today, patient arrives in good spirits and presents without any assistance. She has done better with adherence overall since her last PCP visit. Denies any missed doses of Basaglar or Humalog, however, is not checking blood sugar levels at home.   Family/Social History:  -Fhx: DM, HTN -Tobacco: former smoker -Alcohol: none reported   Current diabetes medications include: Basaglar 30u twice daily, Humaog 8u TID before meals, metformin 1000 mg BID  Patient reports adherence to taking all medications as prescribed.   Insurance coverage: Medicaid  Patient denies hypoglycemic events.***  Reported home fasting blood sugars: not checking  *** Reported 2 hour post-meal/random blood sugars: not checking  Patient denies nocturia (nighttime urination). *** Patient denies neuropathy (nerve pain). Patient reports visual changes. Patient denies self foot exams.   Patient reported dietary habits: *** -Tries to adhere to a diabetic diet  Patient-reported exercise habits: *** -Walks a lot at work, but no formal exercise regimen outside of work.    O:   ROS  Physical Exam  7 day average blood glucose: no GM with her today.   No CGM in place.   Lab Results  Component Value Date   HGBA1C 9.6 (A) 08/06/2023   There were no vitals filed for this visit.  UACR 01/06/23: 436 mg/g  Lipid Panel     Component Value Date/Time   CHOL 151 08/06/2023 1702   TRIG 69 08/06/2023 1702   HDL 75 08/06/2023 1702    CHOLHDL 2.0 08/06/2023 1702   CHOLHDL 3.0 07/25/2013 1246   VLDL 9 07/25/2013 1246   LDLCALC 62 08/06/2023 1702    Clinical Atherosclerotic Cardiovascular Disease (ASCVD): No  The 10-year ASCVD risk score (Arnett DK, et al., 2019) is: 10.7%   Values used to calculate the score:     Age: 81 years     Sex: Female     Is Non-Hispanic African American: Yes     Diabetic: Yes     Tobacco smoker: No     Systolic Blood Pressure: 163 mmHg     Is BP treated: Yes     HDL Cholesterol: 75 mg/dL     Total Cholesterol: 151 mg/dL   JYNW2N with macroalbuminuria!! F/u insulin - can consider consolidating if better for adherence F/u checking BG F/u BP and adherence  A/P: Diabetes longstanding currently uncontrolled based on A1c. Level of control at home unknown. Encouraged patient to pick-up True Metrix supplies from our pharmacy and begin taking at home. Patient is able to verbalize appropriate hypoglycemia management plan. Medication adherence appears appropriate. -Continued current regimen. It is hard for [place] to know what to adjust without home readings.  -True Metrix supplies sent.   -Extensively discussed pathophysiology of diabetes, recommended lifestyle interventions, dietary effects on blood sugar control.  -Counseled on s/sx of and management of hypoglycemia.  -Next A1c anticipated 05/2022.   Written patient instructions provided. Patient verbalized understanding of treatment plan.  Total time in face to face counseling 30 minutes.  Follow-up:  Pharmacist in 1 month.  Butch Penny, PharmD, Patsy [place], CPP Clinical Pharmacist [facility] [phone]

## 2023-10-12 ENCOUNTER — Ambulatory Visit: Payer: Medicaid Other | Admitting: Pharmacist

## 2023-10-13 ENCOUNTER — Other Ambulatory Visit: Payer: Self-pay

## 2023-10-15 ENCOUNTER — Other Ambulatory Visit: Payer: Self-pay

## 2023-10-26 ENCOUNTER — Other Ambulatory Visit: Payer: Self-pay

## 2023-10-29 ENCOUNTER — Other Ambulatory Visit: Payer: Self-pay | Admitting: Physician Assistant

## 2023-10-29 DIAGNOSIS — I152 Hypertension secondary to endocrine disorders: Secondary | ICD-10-CM

## 2023-10-30 ENCOUNTER — Other Ambulatory Visit: Payer: Self-pay

## 2023-10-30 MED ORDER — AMLODIPINE BESYLATE 5 MG PO TABS
5.0000 mg | ORAL_TABLET | Freq: Every day | ORAL | 0 refills | Status: DC
Start: 2023-10-30 — End: 2023-11-10
  Filled 2023-10-30: qty 30, 30d supply, fill #0

## 2023-10-30 NOTE — Telephone Encounter (Signed)
 Requested Prescriptions  Pending Prescriptions Disp Refills   amLODipine (NORVASC) 5 MG tablet 30 tablet 0    Sig: Take 1 tablet (5 mg total) by mouth daily.     Cardiovascular: Calcium Channel Blockers 2 Failed - 10/30/2023 12:23 PM      Failed - Last BP in normal range    BP Readings from Last 1 Encounters:  08/06/23 (!) 163/87         Passed - Last Heart Rate in normal range    Pulse Readings from Last 1 Encounters:  08/06/23 70         Passed - Valid encounter within last 6 months    Recent Outpatient Visits           2 months ago Type 2 diabetes mellitus with hyperglycemia, with long-term current use of insulin (HCC)   [facility] [place]. 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 [facility] Marcine Matar, MD   11 months ago Essential hypertension   [place] [facility] - [facility] Dept Of [place]. [facility] Yehuda Savannah L, RPH-CPP   1 year ago Diabetes mellitus with macroalbuminuric diabetic nephropathy [facility])   [place] [facility] - A Dept Of [place]. [facility] [place], [place], [place]   1 year ago Diabetes mellitus type 2 in obese [facility])   [place] [facility] - A Dept Of [place]. [facility] Drucilla Chalet, RPH-CPP       Future Appointments             In 1 week Marcine Matar, MD [facility] [place] - A Dept Of [place]ideg[place]. [facility]

## 2023-11-04 ENCOUNTER — Other Ambulatory Visit: Payer: Self-pay

## 2023-11-09 ENCOUNTER — Other Ambulatory Visit: Payer: Self-pay

## 2023-11-10 ENCOUNTER — Ambulatory Visit: Payer: Self-pay | Attending: Internal Medicine | Admitting: Internal Medicine

## 2023-11-10 ENCOUNTER — Other Ambulatory Visit: Payer: Self-pay

## 2023-11-10 ENCOUNTER — Encounter: Payer: Self-pay | Admitting: Internal Medicine

## 2023-11-10 VITALS — BP 174/81 | HR 69 | Temp 97.6°F | Ht 61.0 in | Wt 189.0 lb

## 2023-11-10 DIAGNOSIS — E1165 Type 2 diabetes mellitus with hyperglycemia: Secondary | ICD-10-CM | POA: Diagnosis not present

## 2023-11-10 DIAGNOSIS — I10 Essential (primary) hypertension: Secondary | ICD-10-CM

## 2023-11-10 DIAGNOSIS — Z7984 Long term (current) use of oral hypoglycemic drugs: Secondary | ICD-10-CM

## 2023-11-10 DIAGNOSIS — Z794 Long term (current) use of insulin: Secondary | ICD-10-CM | POA: Diagnosis not present

## 2023-11-10 DIAGNOSIS — Z1231 Encounter for screening mammogram for malignant neoplasm of breast: Secondary | ICD-10-CM

## 2023-11-10 DIAGNOSIS — E785 Hyperlipidemia, unspecified: Secondary | ICD-10-CM | POA: Diagnosis not present

## 2023-11-10 DIAGNOSIS — E1159 Type 2 diabetes mellitus with other circulatory complications: Secondary | ICD-10-CM

## 2023-11-10 DIAGNOSIS — E119 Type 2 diabetes mellitus without complications: Secondary | ICD-10-CM

## 2023-11-10 LAB — POCT GLYCOSYLATED HEMOGLOBIN (HGB A1C): HbA1c, POC (controlled diabetic range): 10 % — AB (ref 0.0–7.0)

## 2023-11-10 LAB — GLUCOSE, POCT (MANUAL RESULT ENTRY): POC Glucose: 244 mg/dL — AB (ref 70–99)

## 2023-11-10 MED ORDER — AMLODIPINE BESYLATE 10 MG PO TABS
10.0000 mg | ORAL_TABLET | Freq: Every day | ORAL | 1 refills | Status: DC
  Filled 2023-11-10 (×2): qty 90, 90d supply, fill #0
  Filled 2024-02-13: qty 90, 90d supply, fill #1

## 2023-11-10 NOTE — Patient Instructions (Signed)
 VISIT SUMMARY:  You came in today for a follow-up visit to manage your diabetes, hypertension, and hyperlipidemia. We discussed your current medication regimen and your adherence to it, as well as your blood sugar monitoring habits. We also reviewed your recent lab results and made some adjustments to your treatment plan.  YOUR PLAN:  -TYPE 1 DIABETES MELLITUS: Type 1 Diabetes Mellitus is a condition where your body does not produce insulin, requiring you to take insulin to manage your blood sugar levels. We discussed the potential use of Ozempic, pending the results of a pancreatic antibody test to confirm your diabetes type. Please use your continuous glucose monitor more consistently and continue with your current insulin regimen. We will review your glucose monitor data at your next visit in 3 weeks.  -HYPERTENSION: Hypertension, or high blood pressure, can increase your risk of heart disease and stroke. Your blood pressure was elevated today, so we are increasing your amlodipine dose to 10 mg daily. Please continue taking olmesartan/hydrochlorothiazide as prescribed. We will recheck your blood pressure at your next visit.  -HYPERLIPIDEMIA: Hyperlipidemia is a condition where you have high levels of cholesterol in your blood, which can increase your risk of heart disease. Your cholesterol levels were normal in January, and you are compliant with your atorvastatin medication. Please continue taking atorvastatin 10 mg daily.  INSTRUCTIONS:  Please follow up in 3 weeks with your glucose monitor data. We will also recheck your blood pressure at that time. Continue with your current medications and the adjustments we discussed today.

## 2023-11-10 NOTE — Progress Notes (Signed)
 Patient ID: Emily Clay, female    DOB: 06-04-1971  MRN: [id number]  CC: Diabetes (DM & HTN f/u. /No questions / concerns/Yes to Tdap & shingles vax)   Subjective: Emily Clay is a 53 y.o. female who presents for chronic ds management. Her concerns today include:  Patient with history of former tob dep, diabetes type 2, HTN, obesity   Discussed the use of AI scribe software for clinical note transcription with the patient, who gave verbal consent to proceed.  History of Present Illness   The patient, with a history of diabetes, hypertension, and hyperlipidemia, presents for a follow-up visit.   DM: Results for orders placed or performed in visit on 11/10/23  POCT glucose (manual entry)   Collection Time: 11/10/23  4:40 PM  Result Value Ref Range   POC Glucose 244 (A) 70 - 99 mg/dl  POCT glycosylated hemoglobin (Hb A1C)   Collection Time: 11/10/23  4:45 PM  Result Value Ref Range   Hemoglobin A1C     HbA1c POC (<> result, manual entry)     HbA1c, POC (prediabetic range)     HbA1c, POC (controlled diabetic range) 10.0 (A) 0.0 - 7.0 %  She reports adherence to her current medication regimen, which includes metformin 1000mg  twice daily, Lantus insulin 30 units twice daily, and Humalog insulin 8 units twice daily with BF and dinner. She admits to occasionally forgetting to take her metformin, missing approximately two doses per week. She also reports inconsistent blood sugar monitoring, checking her levels manually three to four times per week, usually upon waking or before bed.  She has no numbers to share other than to say that sometimes the blood sugars are high and sometimes low.  She has a continuous glucose monitor but has only used it twice. She acknowledges that the monitor is helpful in tracking her blood sugar levels and influencing her eating habits. She expresses a commitment to using the monitor more consistently moving forward.  Feels she does a good job with choosing  healthier foods but tends to overeat.  The patient also reports adherence to her hypertension and hyperlipidemia medications, which include amlodipine 5mg  daily, Benicar 40/12.5 mg once a day and atorvastatin 10mg  daily. She does not have a home blood pressure monitor but reports limiting her salt intake.  Most recent LDL cholesterol done in January of this year was 52.     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 E11.9 100 each 6   atorvastatin (LIPITOR) 10 MG tablet Take 1 tablet (10 mg total) by mouth once daily. 90 tablet 1   Blood Glucose Monitoring Suppl (ACCU-CHEK GUIDE) w/Device KIT Use to check blood sugar 3 times daily. 1 kit 0   Blood Pressure Monitoring (BLOOD PRESSURE CUFF) MISC Take BP 1-2 times daily, write numbers down in a book and bring to your primary care doctor appointment. 1 each 0   Continuous Glucose Sensor (FREESTYLE LIBRE 3 SENSOR) MISC Place 1 sensor on the skin every 14 days. Use to check glucose continuously 2 each 6   glucose blood (ACCU-CHEK GUIDE)  test strip Use to check blood sugar 3 times daily. E11.9 100 each 6   glucose blood (TRUE METRIX BLOOD GLUCOSE TEST) test strip Use 1 strip to check blood sugar 3 times daily . 100 each 2   Insulin Glargine Solostar (LANTUS) 100 UNIT/ML Solostar Pen Inject 30 Units into the skin in the morning and at bedtime. 15 mL 3   insulin lispro (HUMALOG KWIKPEN) 100 UNIT/ML KwikPen Inject 3 Units into the skin 3 (three) times daily with meals. 9 mL 1   Insulin Pen Needle (TRUEPLUS PEN NEEDLES) 31G X 8 MM MISC USE  AS DIRECTED 100 each 6   meloxicam (MOBIC) 15 MG tablet Take 1 tablet (15 mg total) by mouth daily. 30 tablet 0   metFORMIN (GLUCOPHAGE) 1000 MG tablet Take 1 tablet (1,000 mg total) by mouth 2 (two) times daily with a meal. 180 tablet 1   methocarbamol (ROBAXIN) 500 MG tablet Take 2 tablets (1,000 mg total) by mouth every 8 (eight) hours as needed. 90 tablet 0   olmesartan-hydrochlorothiazide (BENICAR HCT) 40-12.5 MG tablet Take 1 tablet by mouth daily.Must have office visit for refills 90 tablet 1   No current facility-administered medications on file prior to visit.    No Known Allergies  Social History   Socioeconomic History   Marital status: Single    Spouse name: Not on file   Number of children: 1   Years of education: Not on file   Highest education level: 12th grade  Occupational History   Not on file  Tobacco Use   Smoking status: Former    Types: Cigarettes   Smokeless tobacco: Never  Vaping Use   Vaping status: Never Used  Substance and Sexual Activity   Alcohol use: Yes    Alcohol/week: 4.0 standard drinks of alcohol    Types: 4 Cans of beer per week   Drug use: No   Sexual activity: Yes  Other Topics Concern   Not on file  Social History Narrative   Not on file   Social Drivers of Health   Financial Resource Strain: Medium Risk (11/10/2023)   Overall Financial Resource Strain (CARDIA)    Difficulty of Paying Living Expenses: Somewhat hard  Food Insecurity: Food Insecurity Present (11/10/2023)   Hunger Vital Sign    Worried About Running Out of Food in the Last Year: Sometimes true    Ran Out of Food in the Last Year: Never true  Transportation Needs: No Transportation Needs (11/10/2023)   PRAPARE - Administrator, Civil Service (Medical): No    Lack of Transportation (Non-Medical): No  Physical Activity: Sufficiently Active (11/10/2023)   Exercise Vital Sign    Days of Exercise per Week: 3 days    Minutes of Exercise per Session: 60 min   Stress: No Stress Concern Present (11/10/2023)   [employer] of Occupational Health - Occupational Stress Questionnaire    Feeling of Stress : Not at all  Social Connections: Moderately Integrated (11/10/2023)   Social Connection and Isolation Panel [NHANES]    Frequency of Communication with Friends and Family: More than three times a week    Frequency of Social Gatherings with Friends and Family: More than three times a week    Attends Religious Services: More than 4 times per year    Active Member of [employer] or Organizations: Yes    Attends Engineer, structural: More than 4 times per year    Marital Status: Never married  Catering manager  Violence: Not At Risk (11/10/2023)   Humiliation, Afraid, Rape, and Kick questionnaire    Fear of Current or Ex-Partner: No    Emotionally Abused: No    Physically Abused: No    Sexually Abused: No    Family History  Problem Relation Age of Onset   Diabetes Mother    Hypertension Mother    Cancer Mother    Diabetes Sister    Hypertension Sister    Hypertension Brother    Diabetes Maternal Aunt     Past Surgical History:  Procedure Laterality Date   CESAREAN SECTION      ROS: Review of Systems Negative except as stated above  PHYSICAL EXAM: BP (!) 174/81   Pulse 69   Temp 97.6 F (36.4 C) (Oral)   Ht 5\' 1"  (1.549 m)   Wt 189 lb (85.7 kg)   SpO2 99%   BMI 35.71 kg/m   Physical Exam  General appearance - alert, well appearing, and in no distress Mental status - normal mood, behavior, speech, dress, motor activity, and thought processes Neck - supple, no significant adenopathy Chest - clear to auscultation, no wheezes, rales or rhonchi, symmetric air entry Heart - normal rate, regular rhythm, normal S1, S2, no murmurs, rubs, clicks or gallops Extremities - peripheral pulses normal, no pedal edema, no clubbing or cyanosis      Latest Ref Rng & Units 09/29/2023    9:23 AM 08/06/2023    5:02 PM 10/02/2022    4:50 PM   CMP  Glucose 70 - 99 mg/dL 161  096  045   BUN 6 - 24 mg/dL 16  12  16    Creatinine 0.57 - 1.00 mg/dL 4.09  8.11  9.14   Sodium 134 - 144 mmol/L 140  135  141   Potassium 3.5 - 5.2 mmol/L 4.7  5.4  4.3   Chloride 96 - 106 mmol/L 103  96  104   CO2 20 - 29 mmol/L 21  22  21    Calcium 8.7 - 10.2 mg/dL 9.6  9.1  9.5   Total Protein 6.0 - 8.5 g/dL  7.4  7.0   Total Bilirubin 0.0 - 1.2 mg/dL  0.3  0.2   Alkaline Phos 44 - 121 IU/L  66  56   AST 0 - 40 IU/L  31  21   ALT 0 - 32 IU/L  21  22    Lipid Panel     Component Value Date/Time   CHOL 151 08/06/2023 1702   TRIG 69 08/06/2023 1702   HDL 75 08/06/2023 1702   CHOLHDL 2.0 08/06/2023 1702   CHOLHDL 3.0 07/25/2013 1246   VLDL 9 07/25/2013 1246   LDLCALC 62 08/06/2023 1702    CBC    Component Value Date/Time   WBC 6.2 10/02/2022 1650   WBC 5.0 07/25/2013 1246   RBC 4.18 10/02/2022 1650   RBC 4.28 07/25/2013 1246   HGB 13.0 10/02/2022 1650   HCT 38.6 10/02/2022 1650   PLT 323 10/02/2022 1650   MCV 92 10/02/2022 1650   MCH 31.1 10/02/2022 1650   MCH 30.8 07/25/2013 1246   MCHC 33.7 10/02/2022 1650   MCHC 33.7 07/25/2013 1246   RDW 12.2 10/02/2022 1650   LYMPHSABS 1.5 10/02/2022 1650   MONOABS 0.3 07/25/2013 1246   EOSABS 0.1 10/02/2022 1650   BASOSABS 0.1 10/02/2022 1650    ASSESSMENT AND PLAN: 1. Type 2 diabetes mellitus with hyperglycemia, with long-term current use of insulin (HCC) (  Primary) Not at goal.  Patient reports compliance with Lantus and Humalog insulin and occasionally misses evening dose of metformin.  She has no blood sugar readings to share.  Encouraged her to start using her continuous glucose monitor and we will have her follow-up in 4 weeks to reassess and see what her blood sugars are doing.  For now we will keep her on her current doses of medications. -She is likely a good candidate for Ozempic or Mounjaro.  However I would like to check beta cell antibodies to confirm whether she is type I or type  II diabetic.  She reports being diagnosed with diabetes at the age of 34. Encourage healthy eating habits and smaller portion sizes. - POCT glycosylated hemoglobin (Hb A1C) - POCT glucose (manual entry) - Anti-islet cell antibody; Future - Glutamic acid decarboxylase auto abs; Future  2. Diabetes mellitus treated with oral medication (HCC) See #1 above  3. Hypertension associated with diabetes (HCC) Not at goal.  Increase amlodipine to 10 mg daily.  Continue Benicar 40/12.5 mg daily - amLODipine (NORVASC) 10 MG tablet; Take 1 tablet (10 mg total) by mouth daily.  Dispense: 90 tablet; Refill: 1  4. Hyperlipidemia associated with type 2 diabetes mellitus (HCC) At goal.  Continue atorvastatin 10 mg daily  5. Encounter for screening mammogram for malignant neoplasm of breast Referral submitted for mammogram.  Patient was given the opportunity to ask questions.  Patient verbalized understanding of the plan and was able to repeat key elements of the plan.   This documentation was completed using Paediatric nurse.  Any transcriptional errors are unintentional.  Orders Placed This Encounter  Procedures   Anti-islet cell antibody   Glutamic acid decarboxylase auto abs   POCT glycosylated hemoglobin (Hb A1C)   POCT glucose (manual entry)     Requested Prescriptions   Signed Prescriptions Disp Refills   amLODipine (NORVASC) 10 MG tablet 90 tablet 1    Sig: Take 1 tablet (10 mg total) by mouth daily.    Return in about 4 weeks (around 12/08/2023) for For DM recheck.  Give lab appt for this week.  Concetta Dee, MD, FACP

## 2023-11-11 ENCOUNTER — Other Ambulatory Visit: Payer: Self-pay

## 2023-11-12 ENCOUNTER — Other Ambulatory Visit

## 2023-11-16 ENCOUNTER — Other Ambulatory Visit: Payer: Self-pay

## 2023-11-30 ENCOUNTER — Ambulatory Visit: Payer: Self-pay

## 2023-11-30 NOTE — Telephone Encounter (Signed)
 Copied from CRM [id number]. Topic: Clinical - Red Word Triage >> Nov 30, 2023  3:19 PM Emily Clay wrote: Red Word that prompted transfer to Nurse Triage: olmesartan -hydrochlorothiazide  (BENICAR  HCT) 40-12.5 MG tablet symptoms pt is experiencing is weight gain,hair loss,and back pain   Chief Complaint: Medication Question  Symptoms: Back Pain, Hair Loss, Weight Gain Frequency: Acute Pertinent Negatives: Patient denies any other symptoms Disposition: [] ED /[] Urgent Care (no appt availability in office) / [x] Appointment(In office/virtual)/ []  [facility]/ [] Home Care/ [] Refused Recommended Disposition /[] [facility]/ []  Follow-up with PCP Additional Notes: Emily Clay is being triaged for what she believes is side effects of her medication. The patient believes the olmesartan  medication is causing her various side effects, including back pain, hair loss, weight gain. Contacted the [facility], informed to have patient expect a call back regarding switching medications.   Reason for Disposition  [1] Caller has URGENT medicine question about med that PCP or specialist prescribed AND [2] triager unable to answer question  Answer Assessment - Initial Assessment Questions 1. NAME of MEDICINE: "What medicine(s) are you calling about?"     Olmesartan -hydrochlorothiazide   2. QUESTION: "What is your question?" (e.Clay., double dose of medicine, side effect)     Side effects, switching off of the medication  3. PRESCRIBER: "Who prescribed the medicine?" Reason: if prescribed by specialist, call should be referred to that group.    Emily Phoenix, PA  4. SYMPTOMS: "Do you have any symptoms?" If Yes, ask: "What symptoms are you having?"  "How bad are the symptoms (e.Clay., mild, moderate, severe)     Back Pain, Hair loss, Weight Gain  5. PREGNANCY:  "Is there any chance that you are pregnant?" "When was your last menstrual period?"     No and No  Protocols used: Medication Question Call-A-AH

## 2023-11-30 NOTE — Telephone Encounter (Signed)
Call placed to patient unable to reach message left on VM.  Call to schedule VV

## 2023-12-01 NOTE — Telephone Encounter (Signed)
 Patient called back & scheduled appointment for 12/08/23 to address concerns.

## 2023-12-04 ENCOUNTER — Encounter (HOSPITAL_COMMUNITY): Payer: Self-pay

## 2023-12-08 ENCOUNTER — Encounter: Payer: Self-pay | Admitting: Nurse Practitioner

## 2023-12-08 ENCOUNTER — Other Ambulatory Visit: Payer: Self-pay

## 2023-12-08 ENCOUNTER — Ambulatory Visit: Attending: Nurse Practitioner | Admitting: Nurse Practitioner

## 2023-12-08 ENCOUNTER — Other Ambulatory Visit: Payer: Self-pay | Admitting: Physician Assistant

## 2023-12-08 VITALS — BP 149/78 | HR 66 | Resp 19 | Ht 61.0 in | Wt 188.0 lb

## 2023-12-08 DIAGNOSIS — M545 Low back pain, unspecified: Secondary | ICD-10-CM | POA: Diagnosis not present

## 2023-12-08 DIAGNOSIS — E1165 Type 2 diabetes mellitus with hyperglycemia: Secondary | ICD-10-CM | POA: Diagnosis not present

## 2023-12-08 DIAGNOSIS — E28319 Asymptomatic premature menopause: Secondary | ICD-10-CM

## 2023-12-08 DIAGNOSIS — I152 Hypertension secondary to endocrine disorders: Secondary | ICD-10-CM | POA: Diagnosis not present

## 2023-12-08 DIAGNOSIS — Z794 Long term (current) use of insulin: Secondary | ICD-10-CM

## 2023-12-08 DIAGNOSIS — E1159 Type 2 diabetes mellitus with other circulatory complications: Secondary | ICD-10-CM

## 2023-12-08 MED ORDER — OLMESARTAN MEDOXOMIL-HCTZ 40-12.5 MG PO TABS
1.0000 | ORAL_TABLET | Freq: Every day | ORAL | 1 refills | Status: DC
  Filled 2023-12-08 – 2024-02-01 (×2): qty 90, 90d supply, fill #0
  Filled 2024-04-28: qty 90, 90d supply, fill #1

## 2023-12-08 MED ORDER — MELOXICAM 15 MG PO TABS
15.0000 mg | ORAL_TABLET | Freq: Every day | ORAL | 1 refills | Status: DC
  Filled 2023-12-08: qty 30, 30d supply, fill #0
  Filled 2024-01-02 – 2024-01-27 (×2): qty 30, 30d supply, fill #1

## 2023-12-08 MED ORDER — METFORMIN HCL 1000 MG PO TABS
1000.0000 mg | ORAL_TABLET | Freq: Two times a day (BID) | ORAL | 1 refills | Status: DC
  Filled 2023-12-08 – 2024-02-06 (×2): qty 180, 90d supply, fill #0
  Filled 2024-05-09: qty 180, 90d supply, fill #1

## 2023-12-08 NOTE — Progress Notes (Signed)
 Assessment & Plan:  Diagnoses and all orders for this visit:  Hypertension associated with diabetes (HCC) -     olmesartan -hydrochlorothiazide  (BENICAR  HCT) 40-12.5 MG tablet; Take 1 tablet by mouth daily. Continue all antihypertensives as prescribed.  Reminded to bring in blood pressure log for follow  up appointment.  RECOMMENDATIONS: DASH/Mediterranean Diets are healthier choices for HTN.    Acute midline low back pain without sciatica -     meloxicam  (MOBIC ) 15 MG tablet; Take 1 tablet (15 mg total) by mouth daily. For back pain Work on losing weight to help reduce back pain. May alternate with heat and ice application for pain relief. May also alternate with acetaminophen  as prescribed for back pain. Other alternatives include massage, acupuncture and water aerobics.      Early menopause -     Hormone Panel  Type 2 diabetes mellitus with hyperglycemia, with long-term current use of insulin  (HCC) -     metFORMIN  (GLUCOPHAGE ) 1000 MG tablet; Take 1 tablet (1,000 mg total) by mouth 2 (two) times daily with a meal.    Patient has been counseled on age-appropriate routine health concerns for screening and prevention. These are reviewed and up-to-date. Referrals have been placed accordingly. Immunizations are up-to-date or declined.    Subjective:  No chief complaint on file.   Emily Clay 53 y.o. female presents to office today with complaints of medication side effects.  She is a patient of Dr. Lincoln Renshaw with a past medical history of diabetes, hypertension.     Emily Clay states she recently stopped her olmesartan /hydrochlorothiazide  combination as she suspected this was causing her back pain, hair loss and weight gain.  She has been taking Benicar  since last year as well as amlodipine  with amlodipine  being started June 2024 and olmesartan  being started 11/10/2022.  Based on her weight today I do not see any significant weight gain.  She also has a history of chronic back pain  for which she has been prescribed meloxicam  and methocarbamol  in the past.  She states 1000 mg of methocarbamol  caused excessive drowsiness and I have instructed her to try 1 tablet or 500 mg instead.  She was seen in our office several months ago and at that time reported similar symptoms to today including: Pain is across B lower back.  No urinary s/sx.  NKI.  Tylenol  seems to provide some relief.  Occurs any time of day or night and almost daily.  She does think she may have gained some weight which has made it worse.  No radicular s/sx. Denies bowel or bladder incontinence. She states she googled the symptoms and they were noted to be caused by Benicar .  We had a long discussion today and I instructed her that based on her age she is likely perimenopausal and the symptoms she is experiencing is are likely hormonal related and not related to her blood pressure medication.  I explained to her that olmesartan  and hydrochlorothiazide  would not likely cause weight gain and would actually be considered diuretic medications.  She does not endorse any bilateral lower extremity edema or swelling so I do feel she should continue amlodipine  as well.  She also states that she had not had a menstrual cycle in almost a year however she does report having a menstrual cycle recently. So based on this report she is likely perimenopausal and near ing menopause. Also BMI is 35.52 and weight can be a contributing factor to her back pain  Blood pressure is not  at goal and I have instructed her to resume both blood pressure medications at this time. BP Readings from Last 3 Encounters:  12/08/23 (!) 149/78  11/10/23 (!) 174/81  08/06/23 (!) 163/87    Review of Systems  Constitutional:  Negative for fever, malaise/fatigue and weight loss.       SEE HPI  HENT: Negative.  Negative for nosebleeds.   Eyes: Negative.  Negative for blurred vision, double vision and photophobia.  Respiratory: Negative.  Negative for cough and  shortness of breath.   Cardiovascular: Negative.  Negative for chest pain, palpitations and leg swelling.  Gastrointestinal: Negative.  Negative for heartburn, nausea and vomiting.  Musculoskeletal:  Positive for back pain. Negative for myalgias.  Neurological: Negative.  Negative for dizziness, focal weakness, seizures and headaches.  Psychiatric/Behavioral: Negative.  Negative for suicidal ideas.     Past Medical History:  Diagnosis Date   Diabetes mellitus    Hypertension    Proteinuria     Past Surgical History:  Procedure Laterality Date   CESAREAN SECTION      Family History  Problem Relation Age of Onset   Diabetes Mother    Hypertension Mother    Cancer Mother    Diabetes Sister    Hypertension Sister    Hypertension Brother    Diabetes Maternal Aunt     Social History Reviewed with no changes to be made today.   Outpatient Medications Prior to Visit  Medication Sig Dispense Refill   Accu-Chek Softclix Lancets lancets Use to check blood sugar 3 times daily. E11.9 100 each 6   amLODipine  (NORVASC ) 10 MG tablet Take 1 tablet (10 mg total) by mouth daily. 90 tablet 1   atorvastatin  (LIPITOR) 10 MG tablet Take 1 tablet (10 mg total) by mouth once daily. 90 tablet 1   Blood Glucose Monitoring Suppl (ACCU-CHEK GUIDE) w/Device KIT Use to check blood sugar 3 times daily. 1 kit 0   Blood Pressure Monitoring (BLOOD PRESSURE CUFF) MISC Take BP 1-2 times daily, write numbers down in a book and bring to your primary care doctor appointment. 1 each 0   glucose blood (ACCU-CHEK GUIDE) test strip Use to check blood sugar 3 times daily. E11.9 100 each 6   glucose blood (TRUE METRIX BLOOD GLUCOSE TEST) test strip Use 1 strip to check blood sugar 3 times daily . 100 each 2   Insulin  Glargine Solostar (LANTUS ) 100 UNIT/ML Solostar Pen Inject 30 Units into the skin in the morning and at bedtime. 15 mL 3   insulin  lispro (HUMALOG  KWIKPEN) 100 UNIT/ML KwikPen Inject 3 Units into the skin  3 (three) times daily with meals. 9 mL 1   Insulin  Pen Needle (TRUEPLUS PEN NEEDLES) 31G X 8 MM MISC USE AS DIRECTED 100 each 6   metFORMIN  (GLUCOPHAGE ) 1000 MG tablet Take 1 tablet (1,000 mg total) by mouth 2 (two) times daily with a meal. 180 tablet 1   olmesartan -hydrochlorothiazide  (BENICAR  HCT) 40-12.5 MG tablet Take 1 tablet by mouth daily.Must have office visit for refills 90 tablet 1   Continuous Glucose Sensor (FREESTYLE LIBRE 3 SENSOR) MISC Place 1 sensor on the skin Clay 14 days. Use to check glucose continuously (Patient not taking: Reported on 12/08/2023) 2 each 6   methocarbamol  (ROBAXIN ) 500 MG tablet Take 2 tablets (1,000 mg total) by mouth Clay 8 (eight) hours as needed. (Patient not taking: Reported on 12/08/2023) 90 tablet 0   meloxicam  (MOBIC ) 15 MG tablet Take 1 tablet (15 mg total)  by mouth daily. (Patient not taking: Reported on 12/08/2023) 30 tablet 0   No facility-administered medications prior to visit.    No Known Allergies     Objective:    BP (!) 149/78 (BP Location: Left Arm, Patient Position: Sitting, Cuff Size: Normal)   Pulse 66   Resp 19   Ht 5\' 1"  (1.549 m)   Wt 188 lb (85.3 kg)   SpO2 100%   BMI 35.52 kg/m  Wt Readings from Last 3 Encounters:  12/08/23 188 lb (85.3 kg)  11/10/23 189 lb (85.7 kg)  08/06/23 190 lb 9.6 oz (86.5 kg)    Physical Exam Vitals and nursing note reviewed.  Constitutional:      Appearance: She is well-developed.  HENT:     Head: Normocephalic and atraumatic.  Cardiovascular:     Rate and Rhythm: Normal rate and regular rhythm.     Heart sounds: Normal heart sounds. No murmur heard.    No friction rub. No gallop.  Pulmonary:     Effort: Pulmonary effort is normal. No tachypnea or respiratory distress.     Breath sounds: Normal breath sounds. No decreased breath sounds, wheezing, rhonchi or rales.  Chest:     Chest wall: No tenderness.  Abdominal:     General: Bowel sounds are normal.     Palpations: Abdomen is  soft.  Musculoskeletal:        General: Normal range of motion.     Cervical back: Normal range of motion.  Skin:    General: Skin is warm and dry.  Neurological:     Mental Status: She is alert and oriented to person, place, and time.     Coordination: Coordination normal.  Psychiatric:        Behavior: Behavior normal. Behavior is cooperative.        Thought Content: Thought content normal.        Judgment: Judgment normal.          Patient has been counseled extensively about nutrition and exercise as well as the importance of adherence with medications and regular follow-up. The patient was given clear instructions to go to ER or return to medical center if symptoms don't improve, worsen or new problems develop. The patient verbalized understanding.   Follow-up: No follow-ups on file.   Collins Dean, FNP-BC [facility] [place], [place] [phone]   12/08/2023, 7:02 PM

## 2023-12-09 ENCOUNTER — Other Ambulatory Visit: Payer: Self-pay

## 2023-12-09 MED ORDER — INSULIN GLARGINE SOLOSTAR 100 UNIT/ML ~~LOC~~ SOPN
30.0000 [IU] | PEN_INJECTOR | Freq: Two times a day (BID) | SUBCUTANEOUS | 1 refills | Status: DC
  Filled 2023-12-09 – 2023-12-10 (×2): qty 60, 100d supply, fill #0
  Filled 2023-12-13: qty 54, 90d supply, fill #0
  Filled 2024-03-12: qty 54, 90d supply, fill #1

## 2023-12-10 ENCOUNTER — Other Ambulatory Visit: Payer: Self-pay

## 2023-12-11 ENCOUNTER — Ambulatory Visit: Payer: Self-pay | Attending: Pharmacist | Admitting: Pharmacist

## 2023-12-14 ENCOUNTER — Other Ambulatory Visit: Payer: Self-pay

## 2023-12-18 ENCOUNTER — Other Ambulatory Visit: Payer: Self-pay

## 2024-01-13 ENCOUNTER — Other Ambulatory Visit: Payer: Self-pay

## 2024-02-01 ENCOUNTER — Other Ambulatory Visit: Payer: Self-pay

## 2024-02-03 ENCOUNTER — Other Ambulatory Visit: Payer: Self-pay

## 2024-02-06 ENCOUNTER — Other Ambulatory Visit: Payer: Self-pay

## 2024-02-08 ENCOUNTER — Other Ambulatory Visit: Payer: Self-pay

## 2024-02-09 ENCOUNTER — Ambulatory Visit: Payer: Self-pay | Admitting: Internal Medicine

## 2024-02-09 ENCOUNTER — Ambulatory Visit

## 2024-02-09 ENCOUNTER — Ambulatory Visit: Attending: Family Medicine

## 2024-02-09 DIAGNOSIS — E1165 Type 2 diabetes mellitus with hyperglycemia: Secondary | ICD-10-CM

## 2024-02-10 ENCOUNTER — Other Ambulatory Visit: Payer: Self-pay

## 2024-02-11 ENCOUNTER — Ambulatory Visit: Payer: Self-pay | Admitting: Internal Medicine

## 2024-02-11 LAB — ANTI-ISLET CELL ANTIBODY: Islet Cell Ab: NEGATIVE

## 2024-02-11 LAB — GLUTAMIC ACID DECARBOXYLASE AUTO ABS: Glutamic Acid Decarb Ab: 24.8 U/mL — AB (ref 0.0–5.0)

## 2024-02-22 ENCOUNTER — Other Ambulatory Visit: Payer: Self-pay

## 2024-02-22 ENCOUNTER — Telehealth: Payer: Self-pay | Admitting: Internal Medicine

## 2024-02-22 NOTE — Telephone Encounter (Signed)
 Copied from CRM [id number]. Topic: Clinical - Request for Lab/Test Order >> Feb 22, 2024  3:10 PM Jasmin G wrote:  Reason for CRM: Staff from [facility] called due to needing paperwork faxed over in order to get an X'Ray done, please send, it seems like pt has been waiting there for a little while. Fax number: [phone]

## 2024-02-22 NOTE — Telephone Encounter (Signed)
 Paper work has been faxed

## 2024-02-23 ENCOUNTER — Other Ambulatory Visit: Payer: Self-pay | Admitting: Internal Medicine

## 2024-02-23 DIAGNOSIS — Z1231 Encounter for screening mammogram for malignant neoplasm of breast: Secondary | ICD-10-CM

## 2024-02-24 ENCOUNTER — Telehealth: Payer: Self-pay | Admitting: Internal Medicine

## 2024-02-24 NOTE — Telephone Encounter (Signed)
 Pt would need to be seen

## 2024-02-24 NOTE — Telephone Encounter (Signed)
 Copied from CRM [id number]. Topic: Clinical - Request for Lab/Test Order >> Feb 23, 2024  2:37 PM Kevelyn M wrote:  Patient saw Jon Moores in Jan 2025 for DM f/up but also discussed lower back pain. Patient would like to get pictures taken of back. Please call back when order has been placed. Call back #[phone]

## 2024-02-26 NOTE — Telephone Encounter (Signed)
 Called patient and left voicemail  *When patient calls back in please make appointment to discuss back pain *

## 2024-03-04 ENCOUNTER — Other Ambulatory Visit: Payer: Self-pay | Admitting: Internal Medicine

## 2024-03-04 ENCOUNTER — Other Ambulatory Visit: Payer: Self-pay

## 2024-03-04 DIAGNOSIS — E1165 Type 2 diabetes mellitus with hyperglycemia: Secondary | ICD-10-CM

## 2024-03-04 MED ORDER — INSULIN LISPRO (1 UNIT DIAL) 100 UNIT/ML (KWIKPEN)
3.0000 [IU] | PEN_INJECTOR | Freq: Three times a day (TID) | SUBCUTANEOUS | 0 refills | Status: DC
  Filled 2024-03-04: qty 3, 34d supply, fill #0

## 2024-03-07 ENCOUNTER — Other Ambulatory Visit: Payer: Self-pay

## 2024-03-07 NOTE — Telephone Encounter (Signed)
 Patient called back. Let patient know an appointment was needed to discuss back pain. Patient stated she already had an appointment for 9-2 but that appointment was for 3 month follow up. Went to schedule patient an appointment to discuss back and next available was the middle of September. Patient stated there was an order already put in for [facility] Lumbar Spine 2-3 Views in January by PA Jon Moores but it expired before she could go. Wanted to know if order could be put in again. Please contact the patient. Call back number is [phone]

## 2024-03-10 ENCOUNTER — Other Ambulatory Visit: Payer: Self-pay

## 2024-03-14 ENCOUNTER — Other Ambulatory Visit: Payer: Self-pay

## 2024-03-18 ENCOUNTER — Other Ambulatory Visit: Payer: Self-pay

## 2024-03-21 ENCOUNTER — Other Ambulatory Visit: Payer: Self-pay

## 2024-03-25 ENCOUNTER — Telehealth: Payer: Self-pay | Admitting: Internal Medicine

## 2024-03-25 NOTE — Telephone Encounter (Signed)
 Called patient, no answer. Left voicemail confirming upcoming appointment on 03/29/2024 Provided callback number for any questions or changes.

## 2024-03-29 ENCOUNTER — Encounter: Payer: Self-pay | Admitting: Internal Medicine

## 2024-03-29 ENCOUNTER — Ambulatory Visit: Attending: Internal Medicine | Admitting: Internal Medicine

## 2024-03-29 ENCOUNTER — Other Ambulatory Visit: Payer: Self-pay

## 2024-03-29 VITALS — BP 130/70 | HR 77 | Temp 98.3°F | Ht 61.0 in | Wt 198.0 lb

## 2024-03-29 DIAGNOSIS — Z1211 Encounter for screening for malignant neoplasm of colon: Secondary | ICD-10-CM

## 2024-03-29 DIAGNOSIS — E785 Hyperlipidemia, unspecified: Secondary | ICD-10-CM

## 2024-03-29 DIAGNOSIS — E1169 Type 2 diabetes mellitus with other specified complication: Secondary | ICD-10-CM

## 2024-03-29 DIAGNOSIS — E1159 Type 2 diabetes mellitus with other circulatory complications: Secondary | ICD-10-CM

## 2024-03-29 DIAGNOSIS — Z23 Encounter for immunization: Secondary | ICD-10-CM | POA: Diagnosis not present

## 2024-03-29 DIAGNOSIS — M25512 Pain in left shoulder: Secondary | ICD-10-CM

## 2024-03-29 DIAGNOSIS — G8929 Other chronic pain: Secondary | ICD-10-CM

## 2024-03-29 DIAGNOSIS — Z79899 Other long term (current) drug therapy: Secondary | ICD-10-CM

## 2024-03-29 DIAGNOSIS — Z2821 Immunization not carried out because of patient refusal: Secondary | ICD-10-CM

## 2024-03-29 DIAGNOSIS — Z794 Long term (current) use of insulin: Secondary | ICD-10-CM

## 2024-03-29 DIAGNOSIS — E1165 Type 2 diabetes mellitus with hyperglycemia: Secondary | ICD-10-CM | POA: Diagnosis not present

## 2024-03-29 DIAGNOSIS — I152 Hypertension secondary to endocrine disorders: Secondary | ICD-10-CM

## 2024-03-29 DIAGNOSIS — M545 Low back pain, unspecified: Secondary | ICD-10-CM

## 2024-03-29 LAB — POCT GLYCOSYLATED HEMOGLOBIN (HGB A1C): HbA1c, POC (controlled diabetic range): 7.9 % — AB (ref 0.0–7.0)

## 2024-03-29 LAB — GLUCOSE, POCT (MANUAL RESULT ENTRY): POC Glucose: 153 mg/dL — AB (ref 70–99)

## 2024-03-29 MED ORDER — ATORVASTATIN CALCIUM 10 MG PO TABS
10.0000 mg | ORAL_TABLET | Freq: Every day | ORAL | 1 refills | Status: AC
  Filled 2024-03-29 – 2024-04-28 (×2): qty 90, 90d supply, fill #0
  Filled 2024-07-27 – 2024-08-10 (×2): qty 90, 90d supply, fill #1

## 2024-03-29 MED ORDER — INSULIN GLARGINE SOLOSTAR 100 UNIT/ML ~~LOC~~ SOPN
30.0000 [IU] | PEN_INJECTOR | Freq: Two times a day (BID) | SUBCUTANEOUS | 1 refills | Status: AC
  Filled 2024-03-29 – 2024-06-14 (×2): qty 54, 90d supply, fill #0

## 2024-03-29 MED ORDER — METHOCARBAMOL 500 MG PO TABS
1000.0000 mg | ORAL_TABLET | Freq: Three times a day (TID) | ORAL | 0 refills | Status: AC | PRN
  Filled 2024-03-29: qty 90, 15d supply, fill #0

## 2024-03-29 MED ORDER — INSULIN LISPRO (1 UNIT DIAL) 100 UNIT/ML (KWIKPEN)
8.0000 [IU] | PEN_INJECTOR | Freq: Three times a day (TID) | SUBCUTANEOUS | 0 refills | Status: DC
  Filled 2024-03-29 – 2024-03-31 (×3): qty 3, 13d supply, fill #0

## 2024-03-29 MED ORDER — MELOXICAM 15 MG PO TABS
15.0000 mg | ORAL_TABLET | Freq: Every day | ORAL | 1 refills | Status: AC
  Filled 2024-03-29: qty 30, 30d supply, fill #0
  Filled 2024-05-09: qty 30, 30d supply, fill #1

## 2024-03-29 MED ORDER — AMLODIPINE BESYLATE 10 MG PO TABS
10.0000 mg | ORAL_TABLET | Freq: Every day | ORAL | 1 refills | Status: AC
  Filled 2024-03-29 – 2024-05-16 (×2): qty 90, 90d supply, fill #0
  Filled 2024-08-19: qty 90, 90d supply, fill #1

## 2024-03-29 NOTE — Patient Instructions (Signed)
 VISIT SUMMARY:  During your follow-up visit, we discussed your diabetes, hypertension, cholesterol management, and chronic pain issues. Your diabetes management has improved significantly, and your blood pressure is well-controlled. We also addressed your chronic left shoulder and lower back pain.  YOUR PLAN:  -TYPE 2 DIABETES MELLITUS, INSULIN  DEPENDENT, WITH HYPERGLYCEMIA: Type 2 diabetes is a condition where your body does not use insulin  properly, leading to high blood sugar levels. Your A1c has improved from 10 to 7.9, indicating better blood sugar control. Continue taking metformin  1000 mg twice daily, glargine insulin  30 units twice daily, and Humalog  insulin  8 units with breakfast and dinner. Follow up with the clinical pharmacist in 6 weeks and bring your recorded blood sugar readings.  -HYPERTENSION: Hypertension, or high blood pressure, is when the force of your blood against your artery walls is too high. Your blood pressure is well-controlled at 130/70. Continue taking amlodipine  10 mg daily and Benicar  40 mg/12.5 mg daily.  -HYPERLIPIDEMIA: Hyperlipidemia is having high levels of fats (lipids) in your blood, which can increase your risk of heart disease. Continue taking atorvastatin  (Lipitor) 10 mg daily and maintain your good dietary habits and regular physical activity.  -CHRONIC LEFT SHOULDER PAIN: Chronic left shoulder pain, possibly due to bursitis or arthritis, is causing persistent discomfort and affecting your sleep. We will order an x-ray of your left shoulder at [facility] and refer you to orthopedics for further evaluation and management.  -CHRONIC LOW BACK PAIN: Chronic low back pain with a burning sensation that sometimes radiates to your buttocks. We will order an x-ray of your lumbar spine and evaluate the results to determine if you need physical therapy or other treatments.  INSTRUCTIONS:  Follow up with the clinical pharmacist in 6 weeks for diabetes  management and bring your recorded blood sugar readings. Get an x-ray of your left shoulder at [facility] and follow up with orthopedics for further evaluation. Also, get an x-ray of your lumbar spine to help determine the next steps for your lower back pain.

## 2024-03-29 NOTE — Progress Notes (Addendum)
 Patient ID: Emily Clay, female    DOB: [birth year]  MRN: [id number]  CC: Diabetes (DM f/u. Med refill. Charon pain, burning sensation X1 yr/L shoulder pain - bursitis /Discomfort with freestyle libre - discuss if refills are needed/No to flu vax. Yes to shingles vax)   Subjective: Emily Clay is a 53 y.o. female who presents for chronic ds management. 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  History of Present Illness [name] is a 53 year old female with diabetes and hypertension who presents for a follow-up visit.  DM:  Results for orders placed or performed in visit on 03/29/24  POCT glucose (manual entry)   Collection Time: 03/29/24  4:49 PM  Result Value Ref Range   POC Glucose 153 (A) 70 - 99 mg/dl  POCT glycosylated hemoglobin (Hb A1C)   Collection Time: 03/29/24  4:52 PM  Result Value Ref Range   Hemoglobin A1C     HbA1c POC (<> result, manual entry)     HbA1c, POC (prediabetic range)     HbA1c, POC (controlled diabetic range) 7.9 (A) 0.0 - 7.0 %   She has experienced improvement in her diabetes management, with her A1c decreasing from 10 in April to 7.9 currently. She is taking metformin  1000 mg twice daily, insulin  glargine 30 units twice daily, and Humalog  insulin  8 units with breakfast and dinner. Blood sugar is checked mostly in the morning and evening, with morning readings around 96 to 110. She previously used a continuous glucose monitor but discontinued due to discomfort that it caused in her arm. Doing better with eating habits. In terms of exercise she reports she is always on the move.  HTN: Her blood pressure today was 130/70 mmHg. She continues to take amlodipine   10 mg daily and Benicar  40 mg/12.5 mg daily. She limits salt intake in her diet.  She is on atorvastatin  for cholesterol management and tolerating it okay.  She experiences left shoulder pain and reports she was told she had bursitis years ago in both shoulders. The pain is persistent throughout the day, affecting her ability to sleep on that side. No recent injury or x-rays for this issue.  She reports chronic BL lower back pain for about a year, sometimes experiencing a burning sensation that may radiate towards her buttocks. No specific aggravating factors identified. Pain is persistent  HM: declines flu shot. Yes to shingles vaccine. Referred for MMG in July. She was called but did not call back to schedule.  Due for colon cancer screening.  She prefers to do Cologuard test.  Due for diabetic eye exam.    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 E11.9 100 each 6   amLODipine  (NORVASC ) 10 MG tablet Take 1 tablet (10 mg total) by mouth daily. 90 tablet 1  atorvastatin  (LIPITOR) 10 MG tablet Take 1 tablet (10 mg total) by mouth once daily. 90 tablet 1   Blood Glucose Monitoring Suppl (ACCU-CHEK GUIDE) w/Device KIT Use to check blood sugar 3 times daily. 1 kit 0   Blood Pressure Monitoring (BLOOD PRESSURE CUFF) MISC Take BP 1-2 times daily, write numbers down in a book and bring to your primary care doctor appointment. 1 each 0   Continuous Glucose Sensor (FREESTYLE LIBRE 3 SENSOR) MISC Place 1 sensor on the skin every 14 days. Use to check glucose continuously 2 each 6    glucose blood (ACCU-CHEK GUIDE) test strip Use to check blood sugar 3 times daily. E11.9 100 each 6   glucose blood (TRUE METRIX BLOOD GLUCOSE TEST) test strip Use 1 strip to check blood sugar 3 times daily . 100 each 2   Insulin  Glargine Solostar (LANTUS ) 100 UNIT/ML Solostar Pen Inject 30 Units into the skin in the morning and at bedtime. 60 mL 1   insulin  lispro (HUMALOG  KWIKPEN) 100 UNIT/ML KwikPen Inject 3 Units into the skin 3 (three) times daily with meals. 3 mL 0   Insulin  Pen Needle (TRUEPLUS PEN NEEDLES) 31G X 8 MM MISC USE AS DIRECTED 100 each 6   meloxicam  (MOBIC ) 15 MG tablet Take 1 tablet (15 mg total) by mouth daily. For back pain 30 tablet 1   metFORMIN  (GLUCOPHAGE ) 1000 MG tablet Take 1 tablet (1,000 mg total) by mouth 2 (two) times daily with a meal. 180 tablet 1   methocarbamol  (ROBAXIN ) 500 MG tablet Take 2 tablets (1,000 mg total) by mouth every 8 (eight) hours as needed. 90 tablet 0   olmesartan -hydrochlorothiazide  (BENICAR  HCT) 40-12.5 MG tablet Take 1 tablet by mouth daily. 90 tablet 1   No current facility-administered medications on file prior to visit.    No Known Allergies  Social History   Socioeconomic History   Marital status: Single    Spouse name: Not on file   Number of children: 1   Years of education: Not on file   Highest education level: 12th grade  Occupational History   Not on file  Tobacco Use   Smoking status: Former    Types: Cigarettes   Smokeless tobacco: Never  Vaping Use   Vaping status: Never Used  Substance and Sexual Activity   Alcohol use: Yes    Alcohol/week: 4.0 standard drinks of alcohol    Types: 4 Cans of beer per week   Drug use: No   Sexual activity: Yes  Other Topics Concern   Not on file  Social History Narrative   Not on file   Social Drivers of Health   Financial Resource Strain: Medium Risk (11/10/2023)   Overall Financial Resource Strain (CARDIA)    Difficulty of Paying Living Expenses: Somewhat hard  Food  Insecurity: Food Insecurity Present (11/10/2023)   Hunger Vital Sign    Worried About Running Out of Food in the Last Year: Sometimes true    Ran Out of Food in the Last Year: Never true  Transportation Needs: No Transportation Needs (11/10/2023)   PRAPARE - Administrator, Civil Service (Medical): No    Lack of Transportation (Non-Medical): No  Physical Activity: Sufficiently Active (11/10/2023)   Exercise Vital Sign    Days of Exercise per Week: 3 days    Minutes of Exercise per Session: 60 min  Stress: No Stress Concern Present (11/10/2023)   [employer] of Occupational Health - Occupational Stress Questionnaire  Feeling of Stress : Not at all  Social Connections: Moderately Integrated (11/10/2023)   Social Connection and Isolation Panel    Frequency of Communication with Friends and Family: More than three times a week    Frequency of Social Gatherings with Friends and Family: More than three times a week    Attends Religious Services: More than 4 times per year    Active Member of [employer] or Organizations: Yes    Attends Banker Meetings: More than 4 times per year    Marital Status: Never married  Intimate Partner Violence: Not At Risk (11/10/2023)   Humiliation, Afraid, Rape, and Kick questionnaire    Fear of Current or Ex-Partner: No    Emotionally Abused: No    Physically Abused: No    Sexually Abused: No    Family History  Problem Relation Age of Onset   Diabetes Mother    Hypertension Mother    Cancer Mother    Diabetes Sister    Hypertension Sister    Hypertension Brother    Diabetes Maternal Aunt     Past Surgical History:  Procedure Laterality Date   CESAREAN SECTION      ROS: Review of Systems Negative except as stated above  PHYSICAL EXAM: BP 130/70 (BP Location: Left Arm, Patient Position: Sitting, Cuff Size: Large)   Pulse 77   Temp 98.3 F (36.8 C) (Oral)   Ht 5' 1 (1.549 m)   Wt 198 lb (89.8 kg)   SpO2 99%   BMI  37.41 kg/m   Physical Exam  General appearance - alert, well appearing, and in no distress Mental status - normal mood, behavior, speech, dress, motor activity, and thought processes Neck - supple, no significant adenopathy Chest - clear to auscultation, no wheezes, rales or rhonchi, symmetric air entry Heart - normal rate, regular rhythm, normal S1, S2, no murmurs, rubs, clicks or gallops Musculoskeletal -left shoulder: Mild tenderness on palpation of the posterior joint line.  Discomfort with passive range of motion in all directions. No tenderness on palpation of the lumbar spine or surrounding paraspinal muscles. Extremities - peripheral pulses normal, no pedal edema, no clubbing or cyanosis      Latest Ref Rng & Units 09/29/2023    9:23 AM 08/06/2023    5:02 PM 10/02/2022    4:50 PM  CMP  Glucose 70 - 99 mg/dL 628  720  709   BUN 6 - 24 mg/dL 16  12  16    Creatinine 0.57 - 1.00 mg/dL 8.99  9.25  9.13   Sodium 134 - 144 mmol/L 140  135  141   Potassium 3.5 - 5.2 mmol/L 4.7  5.4  4.3   Chloride 96 - 106 mmol/L 103  96  104   CO2 20 - 29 mmol/L 21  22  21    Calcium  8.7 - 10.2 mg/dL 9.6  9.1  9.5   Total Protein 6.0 - 8.5 g/dL  7.4  7.0   Total Bilirubin 0.0 - 1.2 mg/dL  0.3  0.2   Alkaline Phos 44 - 121 IU/L  66  56   AST 0 - 40 IU/L  31  21   ALT 0 - 32 IU/L  21  22    Lipid Panel     Component Value Date/Time   CHOL 151 08/06/2023 1702   TRIG 69 08/06/2023 1702   HDL 75 08/06/2023 1702   CHOLHDL 2.0 08/06/2023 1702   CHOLHDL 3.0 07/25/2013 1246  VLDL 9 07/25/2013 1246   LDLCALC 62 08/06/2023 1702    CBC    Component Value Date/Time   WBC 6.2 10/02/2022 1650   WBC 5.0 07/25/2013 1246   RBC 4.18 10/02/2022 1650   RBC 4.28 07/25/2013 1246   HGB 13.0 10/02/2022 1650   HCT 38.6 10/02/2022 1650   PLT 323 10/02/2022 1650   MCV 92 10/02/2022 1650   MCH 31.1 10/02/2022 1650   MCH 30.8 07/25/2013 1246   MCHC 33.7 10/02/2022 1650   MCHC 33.7 07/25/2013 1246   RDW 12.2  10/02/2022 1650   LYMPHSABS 1.5 10/02/2022 1650   MONOABS 0.3 07/25/2013 1246   EOSABS 0.1 10/02/2022 1650   BASOSABS 0.1 10/02/2022 1650    ASSESSMENT AND PLAN: 1. Type 2 diabetes mellitus with hyperglycemia, with long-term current use of insulin  (HCC) (Primary) Improved but not at goal.  Patient does not have log of blood sugar readings to share.  We will have her continue Lantus  insulin  30 units twice a day and NovoLog  8 units with breakfast and dinner.  Continue metformin  1 g twice a day.  Encouraged her to continue healthy eating habits and regular exercise.  Will bring her back in about 6 weeks to see our clinical pharmacist.  Advised to bring her blood sugar log with her for review to that visit.  She is intolerant of continuous glucose monitor. - POCT glycosylated hemoglobin (Hb A1C) - POCT glucose (manual entry) - Insulin  Glargine Solostar (LANTUS ) 100 UNIT/ML Solostar Pen; Inject 30 Units into the skin in the morning and at bedtime.  Dispense: 60 mL; Refill: 1 - insulin  lispro (HUMALOG  KWIKPEN) 100 UNIT/ML KwikPen; Inject 8 Units into the skin 3 (three) times daily with meals.  Dispense: 3 mL; Refill: 0 - Ambulatory referral to Ophthalmology - Microalbumin / creatinine urine ratio  2. Hypertension associated with diabetes (HCC) At goal.  Continue amlodipine  and Benicar . - amLODipine  (NORVASC ) 10 MG tablet; Take 1 tablet (10 mg total) by mouth daily.  Dispense: 90 tablet; Refill: 1  3. Hyperlipidemia associated with type 2 diabetes mellitus (HCC) Continue atorvastatin   - atorvastatin  (LIPITOR) 10 MG tablet; Take 1 tablet (10 mg total) by mouth once daily.  Dispense: 90 tablet; Refill: 1  4. Chronic bilateral low back pain without sciatica We will start with getting some x-rays of the lower back and further management will depend on results. - meloxicam  (MOBIC ) 15 MG tablet; Take 1 tablet (15 mg total) by mouth daily. For back pain  Dispense: 30 tablet; Refill: 1 - methocarbamol   (ROBAXIN ) 500 MG tablet; Take 2 tablets (1,000 mg total) by mouth every 8 (eight) hours as needed.  Dispense: 90 tablet; Refill: 0 - DG Lumbar Spine Complete; Future  5. Chronic left shoulder pain - AMB referral to orthopedics - DG Shoulder Left; Future  6. Influenza vaccination declined  7. Need for shingles vaccine Given 1st Shingrix  today  8. 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 Would need to check c-peptide levels before consider adding SGLTi  to help in decreasing macroalbuminuria.  Patient was given the opportunity to ask questions.  Patient verbalized understanding of the plan and was able to repeat key elements of the plan.   This documentation was completed using Paediatric nurse.  Any transcriptional errors are unintentional.  Orders Placed This Encounter  Procedures   POCT glycosylated hemoglobin (Hb A1C)   POCT glucose (  manual entry)     Requested Prescriptions   Pending Prescriptions Disp Refills   amLODipine  (NORVASC ) 10 MG tablet 90 tablet 1    Sig: Take 1 tablet (10 mg total) by mouth daily.   atorvastatin  (LIPITOR) 10 MG tablet 90 tablet 1    Sig: Take 1 tablet (10 mg total) by mouth once daily.   Insulin  Glargine Solostar (LANTUS ) 100 UNIT/ML Solostar Pen 60 mL 1    Sig: Inject 30 Units into the skin in the morning and at bedtime.   insulin  lispro (HUMALOG  KWIKPEN) 100 UNIT/ML KwikPen 3 mL 0    Sig: Inject 3 Units into the skin 3 (three) times daily with meals.   meloxicam  (MOBIC ) 15 MG tablet 30 tablet 1    Sig: Take 1 tablet (15 mg total) by mouth daily. For back pain   methocarbamol  (ROBAXIN ) 500 MG tablet 90 tablet 0    Sig: Take 2 tablets (1,000 mg total) by mouth every 8 (eight) hours as needed.    No follow-ups on file.  Barnie Louder, MD, FACP

## 2024-03-30 ENCOUNTER — Telehealth: Payer: Self-pay | Admitting: Internal Medicine

## 2024-03-30 NOTE — Telephone Encounter (Signed)
-----   Message from Barnie Louder sent at 03/29/2024  6:08 PM EDT ----- Regarding: Appt Needs appt with Herlene in 6 wks for DM and appt with me in 4 mths for chronic ds management.

## 2024-03-30 NOTE — Telephone Encounter (Addendum)
 Called patient, no answer. Left voicemail for patient to call back.

## 2024-03-31 ENCOUNTER — Other Ambulatory Visit: Payer: Self-pay

## 2024-03-31 LAB — MICROALBUMIN / CREATININE URINE RATIO
Creatinine, Urine: 150.9 mg/dL
Microalb/Creat Ratio: 594 mg/g{creat} — ABNORMAL HIGH (ref 0–29)
Microalbumin, Urine: 896.1 ug/mL

## 2024-04-02 ENCOUNTER — Ambulatory Visit: Payer: Self-pay | Admitting: Internal Medicine

## 2024-04-06 ENCOUNTER — Other Ambulatory Visit: Payer: Self-pay

## 2024-04-12 ENCOUNTER — Other Ambulatory Visit: Payer: Self-pay

## 2024-04-12 ENCOUNTER — Other Ambulatory Visit: Payer: Self-pay | Admitting: Internal Medicine

## 2024-04-12 DIAGNOSIS — E1165 Type 2 diabetes mellitus with hyperglycemia: Secondary | ICD-10-CM

## 2024-04-12 MED ORDER — INSULIN LISPRO (1 UNIT DIAL) 100 UNIT/ML (KWIKPEN)
8.0000 [IU] | PEN_INJECTOR | Freq: Three times a day (TID) | SUBCUTANEOUS | 3 refills | Status: DC
  Filled 2024-04-12 – 2024-04-14 (×2): qty 6, 25d supply, fill #0
  Filled 2024-05-09: qty 6, 25d supply, fill #1
  Filled 2024-06-14: qty 6, 25d supply, fill #2
  Filled 2024-07-04: qty 6, 25d supply, fill #3

## 2024-04-14 ENCOUNTER — Other Ambulatory Visit: Payer: Self-pay

## 2024-04-17 ENCOUNTER — Encounter (HOSPITAL_COMMUNITY): Payer: Self-pay | Admitting: Emergency Medicine

## 2024-04-17 ENCOUNTER — Emergency Department (HOSPITAL_COMMUNITY)
Admission: EM | Admit: 2024-04-17 | Discharge: 2024-04-18 | Disposition: A | Discharge status: Home / Self Care | Attending: Emergency Medicine | Admitting: Emergency Medicine

## 2024-04-17 DIAGNOSIS — E162 Hypoglycemia, unspecified: Secondary | ICD-10-CM

## 2024-04-17 DIAGNOSIS — Z794 Long term (current) use of insulin: Secondary | ICD-10-CM | POA: Insufficient documentation

## 2024-04-17 DIAGNOSIS — R9431 Abnormal electrocardiogram [ECG] [EKG]: Secondary | ICD-10-CM | POA: Diagnosis not present

## 2024-04-17 DIAGNOSIS — Z7984 Long term (current) use of oral hypoglycemic drugs: Secondary | ICD-10-CM | POA: Insufficient documentation

## 2024-04-17 DIAGNOSIS — E11649 Type 2 diabetes mellitus with hypoglycemia without coma: Secondary | ICD-10-CM | POA: Insufficient documentation

## 2024-04-17 NOTE — ED Provider Notes (Signed)
 [place] EMERGENCY DEPARTMENT AT [facility] Provider Note   CSN: [id number] Arrival date & time: 04/17/24  2328     Patient presents with: Hypoglycemia   Emily Clay is a 53 y.o. female.  {Add pertinent medical, surgical, social history, OB history to YEP:67052} Patient with a history of diabetes here with altered mental status and hypoglycemia.  EMS was called for combative behavior and found her to have a blood sugar of 35.  She did take her Lantus  and NovoLog  this evening but did not eat much.  Did have normal p.o. intake earlier in the day.  No recent medication changes.  No fever.  No chest pain or shortness of breath.  No abdominal pain, nausea or vomiting.  She feels back to baseline now after receiving glucagon by EMS initial blood sugar was 35.  Now up to 105.  States she took her 30 units of Lantus  but did not eat much for dinner besides 1 rib.  Denies headache.  Denies chest pain, shortness of breath, nausea, vomiting, fever, pain with urination or blood in the urine.  Has chronic back pain which is unchanged.  Denies any dizziness or lightheadedness.  The history is provided by the patient.  Hypoglycemia Associated symptoms: no dizziness, no shortness of breath, no vomiting and no weakness        Prior to Admission medications   Medication Sig Start Date End Date Taking? Authorizing Provider  Accu-Chek Softclix Lancets lancets Use to check blood sugar 3 times daily. E11.9 10/03/22   Vicci Barnie NOVAK, MD  amLODipine  (NORVASC ) 10 MG tablet Take 1 tablet (10 mg total) by mouth daily. 03/29/24   Vicci Barnie NOVAK, MD  atorvastatin  (LIPITOR) 10 MG tablet Take 1 tablet (10 mg total) by mouth once daily. 03/29/24   Vicci Barnie NOVAK, MD  Blood Glucose Monitoring Suppl (ACCU-CHEK GUIDE) w/Device KIT Use to check blood sugar 3 times daily. 06/04/23   Vicci Barnie NOVAK, MD  Blood Pressure Monitoring (BLOOD PRESSURE CUFF) MISC Take BP 1-2 times daily, write numbers down in  a book and bring to your primary care doctor appointment. 07/10/22   Enedelia Dorna CHRISTELLA, FNP  Continuous Glucose Sensor (FREESTYLE LIBRE 3 SENSOR) MISC Place 1 sensor on the skin every 14 days. Use to check glucose continuously 01/06/23   Vicci Barnie NOVAK, MD  glucose blood (ACCU-CHEK GUIDE) test strip Use to check blood sugar 3 times daily. E11.9 10/03/22   Vicci Barnie NOVAK, MD  glucose blood (TRUE METRIX BLOOD GLUCOSE TEST) test strip Use 1 strip to check blood sugar 3 times daily . 10/02/22   Danton Jon CHRISTELLA, PA-C  Insulin  Glargine Solostar (LANTUS ) 100 UNIT/ML Solostar Pen Inject 30 Units into the skin in the morning and at bedtime. 03/29/24   Vicci Barnie NOVAK, MD  insulin  lispro (HUMALOG  KWIKPEN) 100 UNIT/ML KwikPen Inject 8 Units into the skin 3 (three) times daily with meals. 04/12/24   Vicci Barnie NOVAK, MD  Insulin  Pen Needle (TRUEPLUS PEN NEEDLES) 31G X 8 MM MISC USE AS DIRECTED 10/02/22   Danton Jon CHRISTELLA, PA-C  meloxicam  (MOBIC ) 15 MG tablet Take 1 tablet (15 mg total) by mouth daily. For back pain 03/29/24   Vicci Barnie NOVAK, MD  metFORMIN  (GLUCOPHAGE ) 1000 MG tablet Take 1 tablet (1,000 mg total) by mouth 2 (two) times daily with a meal. 12/08/23   Theotis Haze ORN, NP  methocarbamol  (ROBAXIN ) 500 MG tablet Take 2 tablets (1,000 mg total) by mouth every  8 (eight) hours as needed. 03/29/24   Vicci Barnie NOVAK, MD  olmesartan -hydrochlorothiazide  (BENICAR  HCT) 40-12.5 MG tablet Take 1 tablet by mouth daily. 12/08/23   Fleming, Zelda W, NP    Allergies: Patient has no known allergies.    Review of Systems  Constitutional:  Negative for activity change, appetite change and fever.  HENT:  Negative for congestion and rhinorrhea.   Respiratory:  Negative for cough, chest tightness and shortness of breath.   Cardiovascular:  Negative for chest pain.  Gastrointestinal:  Negative for abdominal pain, nausea and vomiting.  Genitourinary:  Negative for dysuria and hematuria.  Musculoskeletal:   Negative for arthralgias and myalgias.  Skin:  Negative for rash.  Neurological:  Negative for dizziness, weakness and headaches.   all other systems are negative except as noted in the HPI and PMH.    Updated Vital Signs BP (!) 162/87   Pulse 73   Temp 98.3 F (36.8 C)   Resp 18   SpO2 100%   Physical Exam Vitals and nursing note reviewed.  Constitutional:      General: She is not in acute distress.    Appearance: She is well-developed.  HENT:     Head: Normocephalic and atraumatic.     Mouth/Throat:     Pharynx: No oropharyngeal exudate.  Eyes:     Conjunctiva/sclera: Conjunctivae normal.     Pupils: Pupils are equal, round, and reactive to light.  Neck:     Comments: No meningismus. Cardiovascular:     Rate and Rhythm: Normal rate and regular rhythm.     Heart sounds: Normal heart sounds. No murmur heard. Pulmonary:     Effort: Pulmonary effort is normal. No respiratory distress.     Breath sounds: Normal breath sounds.  Abdominal:     Palpations: Abdomen is soft.     Tenderness: There is no abdominal tenderness. There is no guarding or rebound.  Musculoskeletal:        General: No tenderness. Normal range of motion.     Cervical back: Normal range of motion and neck supple.  Skin:    General: Skin is warm.  Neurological:     Mental Status: She is alert and oriented to person, place, and time.     Cranial Nerves: No cranial nerve deficit.     Motor: No abnormal muscle tone.     Coordination: Coordination normal.     Comments:  5/5 strength throughout. CN 2-12 intact.Equal grip strength.   Psychiatric:        Behavior: Behavior normal.     (all labs ordered are listed, but only abnormal results are displayed) Labs Reviewed  CBC WITH DIFFERENTIAL/PLATELET  COMPREHENSIVE METABOLIC PANEL WITH GFR  URINALYSIS, ROUTINE W REFLEX MICROSCOPIC  CBG MONITORING, ED    EKG: None  Radiology: No results found.  {Document cardiac monitor, telemetry assessment  procedure when appropriate:32947} Procedures   Medications Ordered in the ED - No data to display    {Click here for ABCD2, HEART and other calculators REFRESH Note before signing:1}                              Medical Decision Making Amount and/or Complexity of Data Reviewed Labs: ordered. Decision-making details documented in ED Course. Radiology: ordered and independent interpretation performed. Decision-making details documented in ED Course. ECG/medicine tests: ordered and independent interpretation performed. Decision-making details documented in ED Course.   Combative behavior with hypoglycemia,  now resolved.  Stable vitals.  She feels back to baseline now.  Has chronic back pain which is similar to previous.  Oriented x 3.  Blood sugar up to 105 Likely took her insulin  but did not eat properly  {Document critical care time when appropriate  Document review of labs and clinical decision tools ie [id number], etc  Document your independent review of radiology images and any outside records  Document your discussion with family members, caretakers and with consultants  Document social determinants of health affecting pt's care  Document your decision making why or why not admission, treatments were needed:32947:::1}   Final diagnoses:  None    ED Discharge Orders     None

## 2024-04-17 NOTE — ED Triage Notes (Signed)
 Pt here from home with c/o hypoglycemia , cbg 35 was given glucagon and oral glucose by ems , cbg 105

## 2024-04-18 LAB — CBC WITH DIFFERENTIAL/PLATELET
Abs Immature Granulocytes: 0.05 K/uL (ref 0.00–0.07)
Basophils Absolute: 0 K/uL (ref 0.0–0.1)
Basophils Relative: 0 %
Eosinophils Absolute: 0.1 K/uL (ref 0.0–0.5)
Eosinophils Relative: 1 %
HCT: 39 % (ref 36.0–46.0)
Hemoglobin: 13 g/dL (ref 12.0–15.0)
Immature Granulocytes: 1 %
Lymphocytes Relative: 14 %
Lymphs Abs: 1.3 K/uL (ref 0.7–4.0)
MCH: 31.1 pg (ref 26.0–34.0)
MCHC: 33.3 g/dL (ref 30.0–36.0)
MCV: 93.3 fL (ref 80.0–100.0)
Monocytes Absolute: 0.6 K/uL (ref 0.1–1.0)
Monocytes Relative: 6 %
Neutro Abs: 7.7 K/uL (ref 1.7–7.7)
Neutrophils Relative %: 78 %
Platelets: 291 K/uL (ref 150–400)
RBC: 4.18 MIL/uL (ref 3.87–5.11)
RDW: 13.5 % (ref 11.5–15.5)
WBC: 9.8 K/uL (ref 4.0–10.5)
nRBC: 0 % (ref 0.0–0.2)

## 2024-04-18 LAB — URINALYSIS, ROUTINE W REFLEX MICROSCOPIC
Bacteria, UA: NONE SEEN
Bilirubin Urine: NEGATIVE
Glucose, UA: NEGATIVE mg/dL
Hgb urine dipstick: NEGATIVE
Ketones, ur: NEGATIVE mg/dL
Leukocytes,Ua: NEGATIVE
Nitrite: NEGATIVE
Protein, ur: 100 mg/dL — AB
Specific Gravity, Urine: 1.009 (ref 1.005–1.030)
pH: 5 (ref 5.0–8.0)

## 2024-04-18 LAB — CBG MONITORING, ED
Glucose-Capillary: 113 mg/dL — ABNORMAL HIGH (ref 70–99)
Glucose-Capillary: 113 mg/dL — ABNORMAL HIGH (ref 70–99)
Glucose-Capillary: 119 mg/dL — ABNORMAL HIGH (ref 70–99)
Glucose-Capillary: 125 mg/dL — ABNORMAL HIGH (ref 70–99)
Glucose-Capillary: 46 mg/dL — ABNORMAL LOW (ref 70–99)

## 2024-04-18 LAB — COMPREHENSIVE METABOLIC PANEL WITH GFR
ALT: 27 U/L (ref 0–44)
AST: 29 U/L (ref 15–41)
Albumin: 3.7 g/dL (ref 3.5–5.0)
Alkaline Phosphatase: 56 U/L (ref 38–126)
Anion gap: 14 (ref 5–15)
BUN: 10 mg/dL (ref 6–20)
CO2: 20 mmol/L — ABNORMAL LOW (ref 22–32)
Calcium: 8.7 mg/dL — ABNORMAL LOW (ref 8.9–10.3)
Chloride: 103 mmol/L (ref 98–111)
Creatinine, Ser: 0.77 mg/dL (ref 0.44–1.00)
GFR, Estimated: >60 mL/min (ref >60)
Glucose, Bld: 125 mg/dL — ABNORMAL HIGH (ref 70–99)
Potassium: 3.6 mmol/L (ref 3.5–5.1)
Sodium: 137 mmol/L (ref 135–145)
Total Bilirubin: 0.5 mg/dL (ref 0.0–1.2)
Total Protein: 7.2 g/dL (ref 6.5–8.1)

## 2024-04-18 LAB — TROPONIN I (HIGH SENSITIVITY)
Troponin I (High Sensitivity): 11 ng/L (ref <18)
Troponin I (High Sensitivity): 13 ng/L (ref <18)

## 2024-04-18 MED ORDER — ASPIRIN 81 MG PO CHEW: 324.0000 mg | CHEWABLE_TABLET | Freq: Once | ORAL | Status: DC

## 2024-04-18 MED ORDER — DEXTROSE 50 % IV SOLN
25.0000 mL | Freq: Once | INTRAVENOUS | Status: AC
  Administered 2024-04-18: 25 mL via INTRAVENOUS
  Filled 2024-04-18: qty 50

## 2024-04-18 MED ORDER — LIDOCAINE 5 % EX PTCH
1.0000 | MEDICATED_PATCH | CUTANEOUS | Status: DC
Start: 2024-04-18 — End: 2024-04-18

## 2024-04-18 NOTE — Discharge Instructions (Addendum)
 Make sure you eat when you are taking your insulin .  We are referring you to the cardiologist because your EKG is abnormal and changed from 2013 but no evidence of heart attack today.  Follow-up with your doctor.  Return to the ED with new or worsening symptoms.

## 2024-04-18 NOTE — ED Notes (Signed)
 Son Emily Clay [phone] would like an update asap

## 2024-04-18 NOTE — ED Notes (Signed)
 Unsuccessful IV attempts. Another clinician to attempt.

## 2024-04-18 NOTE — ED Notes (Addendum)
 Discharge instructions reviewed.   Opportunity for questions and concerns provided.   Alert, oriented and escorted to waiting area via wheelchair.   Displays no signs of distress.   Encouraged to follow up with Cardiologist.

## 2024-04-18 NOTE — ED Notes (Signed)
 8oz apple juice provided.   Tolerated oral hydration.

## 2024-04-19 ENCOUNTER — Encounter: Payer: Self-pay | Admitting: Cardiology

## 2024-04-19 ENCOUNTER — Other Ambulatory Visit: Payer: Self-pay

## 2024-04-19 ENCOUNTER — Ambulatory Visit: Attending: Cardiology | Admitting: Cardiology

## 2024-04-19 VITALS — BP 122/62 | HR 69 | Ht 61.0 in | Wt 203.6 lb

## 2024-04-19 DIAGNOSIS — R9431 Abnormal electrocardiogram [ECG] [EKG]: Secondary | ICD-10-CM

## 2024-04-19 DIAGNOSIS — E785 Hyperlipidemia, unspecified: Secondary | ICD-10-CM | POA: Diagnosis not present

## 2024-04-19 DIAGNOSIS — I10 Essential (primary) hypertension: Secondary | ICD-10-CM

## 2024-04-19 DIAGNOSIS — E1169 Type 2 diabetes mellitus with other specified complication: Secondary | ICD-10-CM

## 2024-04-19 NOTE — Progress Notes (Signed)
 Cardiology Office Note   Date:  04/19/2024  ID:  Emily Clay, Emily Clay 1971/03/01, MRN [id number] PCP: Vicci Barnie NOVAK, MD  [facility] Providers Cardiologist:  None Cardiology APP:  Vicci Rollo SAUNDERS, PA-C    History of Present Illness Emily Clay is a 53 y.o. female with a past medical history of type 2 diabetes, hyperlipidemia, hypertension, former smoker.  Presents today for evaluation of abnormal EKG.  Patient was seen in the ED on 04/18/24 with altered mental status and hypoglycemia.  EMS had been called due to combative behavior and she was found to have a blood sugar of 35.  Denied chest pain or shortness of breath.  No abdominal pain, nausea, vomiting.  She was given glucagon by EMS and blood sugar improved. EKG showed sinus rhythm with T wave inversions in lead II, 3, aVF, V6.  High-sensitivity troponin was negative x 2.  She was able to be discharged from the ED with cardiology referral.  On interview, patient denies any chest pain or shortness of breath.  Denies palpitations, dizziness, syncope, near syncope.  Her blood pressure and cholesterol have been well-controlled by her primary care provider.  She had gone to the ED on 9/21 because of confusion and low blood sugar.  Since her blood sugar was corrected she has been feeling well.  She denies any personal or family history of cardiac issues.  Occasionally will smoke a Black and milds, estimates she smokes less than 1/week.  She is active at her job and does not have chest pain or dyspnea on exertion.  Denies lower extremity swelling.   Studies Reviewed     Risk Assessment/Calculations           Physical Exam VS:  BP 122/62 (BP Location: Right Arm, Cuff Size: Large)   Pulse 69   Ht 5' 1 (1.549 m)   Wt 203 lb 9.6 oz (92.4 kg)   SpO2 97%   BMI 38.47 kg/m        Wt Readings from Last 3 Encounters:  04/19/24 203 lb 9.6 oz (92.4 kg)  03/29/24 198 lb (89.8 kg)  12/08/23 188 lb (85.3 kg)    GEN: Well  nourished, well developed in no acute distress. Sitting comfortably on the exam table  NECK: No JVD  CARDIAC: RRR, no murmurs, rubs, gallops. Radial pulses 2+ bilaterally  RESPIRATORY:  Clear to auscultation without rales, wheezing or rhonchi. Normal WOB on room air   ABDOMEN: Soft, non-tender, non-distended EXTREMITIES:  No edema in BLE; No deformity   ASSESSMENT AND PLAN   Abnormal EKG  - Patient was seen in the ED on 9/21 for hypoglycemia. EKG showed NSR with TWI in leads II, III, aVF, V6 - EKG today shows TWI in lead III, biphasic T wave in aVF and V6.  - Patient adamantly denies chest pain, dyspnea on exertion. Works a physically demanding job and tolerates it well  - HTN and HLD are well controlled. Smokes cigars less than once per week. Counseled on cessation. With her lack of anginal symptoms and nonspecific EKG findings, I do not think ischemic evaluation indicated at this time   - Ordered echocardiogram  - Discussed CT calcium  scoring with her risk factors- patient is not interested at this time   HTN  - BP well controlled. No symptoms of orthostatic hypotension  - Continue amlodipine  10 mg daily, olmesartan -hydrochlorothiazide  40-12.5 mg daily  - K 3.6, creatinine 0.778 on 04/18/24   HLD  - LDL 62  in 07/2023  - Continue lipitor 10 mg daily   Type 2 DM  - A1c 7.9 in 03/2024  - Managed by PCP      Dispo: Follow up in 3-4 months with Dr. Anner - if echo is normal, may be able to follow up PRN   Signed, Rollo FABIENE Louder, PA-C

## 2024-04-19 NOTE — Patient Instructions (Addendum)
 Medication Instructions:  Your physician recommends that you continue on your current medications as directed. Please refer to the Current Medication list given to you today. *If you need a refill on your cardiac medications before your next appointment, please call your pharmacy*  Lab Work: NONE ORDERED If you have labs (blood work) drawn today and your tests are completely normal, you will receive your results only by: MyChart Message (if you have MyChart) OR A paper copy in the mail If you have any lab test that is abnormal or we need to change your treatment, we will call you to review the results.  Testing/Procedures: Your physician has requested that you have an echocardiogram. Echocardiography is a painless test that uses sound waves to create images of your heart. It provides your doctor with information about the size and shape of your heart and how well your heart's chambers and valves are working. This procedure takes approximately one hour. There are no restrictions for this procedure. Please do NOT wear cologne, perfume, aftershave, or lotions (deodorant is allowed). Please arrive 15 minutes prior to your appointment time.  Please note: We ask at that you not bring children with you during ultrasound (echo/ vascular) testing. Due to room size and safety concerns, children are not allowed in the ultrasound rooms during exams. Our front office staff cannot provide observation of children in our lobby area while testing is being conducted. An adult accompanying a patient to their appointment will only be allowed in the ultrasound room at the discretion of the ultrasound technician under special circumstances. We apologize for any inconvenience.  Follow-Up: At [facility], you and your health needs are our priority.  As part of our continuing mission to provide you with exceptional heart care, our providers are all part of one team.  This team includes your primary Cardiologist  (physician) and Advanced Practice Providers or APPs (Physician Assistants and Nurse Practitioners) who all work together to provide you with the care you need, when you need it.  Your next appointment:   3-4 month(s)  Provider:   Alm Clay, MD   We recommend signing up for the patient portal called MyChart.  Sign up information is provided on this After Visit Summary.  MyChart is used to connect with patients for Virtual Visits (Telemedicine).  Patients are able to view lab/test results, encounter notes, upcoming appointments, etc.  Non-urgent messages can be sent to your provider as well.   To learn more about what you can do with MyChart, go to [email or web address].   Other Instructions

## 2024-04-21 ENCOUNTER — Ambulatory Visit: Admitting: Physician Assistant

## 2024-04-28 ENCOUNTER — Other Ambulatory Visit: Payer: Self-pay

## 2024-05-03 ENCOUNTER — Other Ambulatory Visit: Payer: Self-pay

## 2024-05-09 ENCOUNTER — Other Ambulatory Visit (INDEPENDENT_AMBULATORY_CARE_PROVIDER_SITE_OTHER): Payer: Self-pay

## 2024-05-09 ENCOUNTER — Ambulatory Visit (INDEPENDENT_AMBULATORY_CARE_PROVIDER_SITE_OTHER): Admitting: Physician Assistant

## 2024-05-09 ENCOUNTER — Other Ambulatory Visit: Payer: Self-pay

## 2024-05-09 DIAGNOSIS — M25512 Pain in left shoulder: Secondary | ICD-10-CM

## 2024-05-09 DIAGNOSIS — G8929 Other chronic pain: Secondary | ICD-10-CM

## 2024-05-09 DIAGNOSIS — M25511 Pain in right shoulder: Secondary | ICD-10-CM | POA: Diagnosis not present

## 2024-05-09 MED ORDER — METHYLPREDNISOLONE ACETATE 40 MG/ML IJ SUSP
40.0000 mg | INTRAMUSCULAR | Status: AC | PRN
  Administered 2024-05-09: 40 mg via INTRA_ARTICULAR

## 2024-05-09 MED ORDER — LIDOCAINE HCL 1 % IJ SOLN
3.0000 mL | INTRAMUSCULAR | Status: AC | PRN
  Administered 2024-05-09: 3 mL

## 2024-05-09 NOTE — Progress Notes (Addendum)
 Office Visit Note   Patient: Emily Clay           Date of Birth: 08-Dec-1970           MRN: [id number] Visit Date: 05/09/2024              Requested by: Vicci Barnie NOVAK, MD [address] [place],  [place] [address] PCP: Vicci Barnie NOVAK, MD   Assessment & Plan: Visit Diagnoses:  1. Chronic right shoulder pain   2. Chronic left shoulder pain     Plan: Patient is given exercises for both shoulders on handouts which were provided.  She will begin performing these.  In regards to the right recommend subacromial injection given the degree of pain on exam today.  She is agreeable.  She knows to watch her glucose levels closely over the next 24 to 48 hours.  She will follow-up with us  in 2 weeks see how she is doing overall.  In regards to the right elbow lateral epicondylitis recommend Voltaren  gel 2 g 4 times daily.  Lifting techniques given.  Follow-Up Instructions: Return in about 2 weeks (around 05/23/2024).   Orders:  Orders Placed This Encounter  Procedures   Large Joint Inj   XR Shoulder Right   XR Shoulder Left   No orders of the defined types were placed in this encounter.     Procedures: Large Joint Inj: R subacromial bursa on 05/09/2024 6:03 PM Indications: pain Details: 22 G 1.5 in needle, superolateral approach  Arthrogram: No  Medications: 3 mL lidocaine  1 %; 40 mg methylPREDNISolone acetate 40 MG/ML Outcome: tolerated well, no immediate complications Procedure, treatment alternatives, risks and benefits explained, specific risks discussed. Consent was given by the patient. Immediately prior to procedure a time out was called to verify the correct patient, procedure, equipment, support staff and site/side marked as required. Patient was prepped and draped in the usual sterile fashion.       Clinical Data: No additional findings.   Subjective: Chief Complaint  Patient presents with   Right Shoulder - Pain   Left Shoulder - Pain     HPI Emily Clay is a 53 year old female were seen for the first time for chronic bilateral shoulder pain.  She states this been ongoing for several years no known injury.  Describes a burning heaviness both shoulders.  No numbness tingling.  No fevers chills.  She is diabetic reports last hemoglobin A1c was 7.9.  She said has pain in both shoulders on and off at times mostly anterior to the top of shoulders.  She also notes right elbow pain that causes her arm to hurt with lifting.  She has had no surgeries no injuries to the shoulders or the elbow.  She is right-hand dominant.  She has taken Tylenol  without any real relief.  Review of Systems See HPI  Objective: Vital Signs: There were no vitals taken for this visit.  Physical Exam Constitutional:      Appearance: She is not ill-appearing or diaphoretic.  Pulmonary:     Effort: Pulmonary effort is normal.  Neurological:     Mental Status: She is alert and oriented to person, place, and time.  Psychiatric:        Mood and Affect: Mood normal.     Ortho Exam Bilateral shoulders: 5 out of 5 strengths with external and internal rotation against resistance.  Positive impingement on the right negative on the left.  Empty can test is negative bilaterally.  Liftoff test is negative on the left on the right she is unable to do a posterior liftoff but is able to lift off anteriorly and has no weakness.  Decreased forward flexion right shoulder lacking about 10 degrees of full forward flexion.  Also internal rotation right shoulder is limited compared to the left which can reach up in the lumbar region. Right elbow good range of motion.  Tenderness over the lateral epicondyle provocative maneuvers causes pain lateral elbow consistent with lateral epicondylitis. Specialty Comments:  No specialty comments available.  Imaging: XR Shoulder Left Result Date: 05/09/2024 Left shoulder 3 views: Shoulder is well located glenohumeral joint is  well-maintained.  Sclerotic activity at the greater tuberosity.  Mild AC joint changes.  No acute fractures acute findings.  XR Shoulder Right Result Date: 05/09/2024 Right shoulder: Shoulder is well located.  No acute fractures acute findings.  Mild AC joint changes.  Sclerotic changes greater tuberosity.  Otherwise no bony abnormalities.    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) 07/25/2013   Muscle strain 07/25/2013   Past Medical History:  Diagnosis Date   Diabetes mellitus    Hypertension    Proteinuria     Family History  Problem Relation Age of Onset   Diabetes Mother    Hypertension Mother    Cancer Mother    Diabetes Sister    Hypertension Sister    Hypertension Brother    Diabetes Maternal Aunt     Past Surgical History:  Procedure Laterality Date   CESAREAN SECTION     Social History   Occupational History   Not on file  Tobacco Use   Smoking status: Former    Types: Cigarettes   Smokeless tobacco: Never  Vaping Use   Vaping status: Never Used  Substance and Sexual Activity   Alcohol use: Yes    Alcohol/week: 4.0 standard drinks of alcohol    Types: 4 Cans of beer per week   Drug use: No   Sexual activity: Yes

## 2024-05-11 ENCOUNTER — Other Ambulatory Visit: Payer: Self-pay

## 2024-05-16 ENCOUNTER — Other Ambulatory Visit: Payer: Self-pay

## 2024-05-17 ENCOUNTER — Other Ambulatory Visit: Payer: Self-pay

## 2024-05-23 ENCOUNTER — Ambulatory Visit: Admitting: Physician Assistant

## 2024-05-26 ENCOUNTER — Ambulatory Visit (HOSPITAL_COMMUNITY)

## 2024-05-30 ENCOUNTER — Encounter: Payer: Self-pay | Admitting: Radiology

## 2024-05-31 ENCOUNTER — Ambulatory Visit (HOSPITAL_COMMUNITY)

## 2024-06-06 ENCOUNTER — Ambulatory Visit: Admitting: Physician Assistant

## 2024-06-14 ENCOUNTER — Other Ambulatory Visit: Payer: Self-pay

## 2024-06-29 ENCOUNTER — Ambulatory Visit: Attending: Cardiology | Admitting: Cardiology

## 2024-07-06 ENCOUNTER — Ambulatory Visit (HOSPITAL_COMMUNITY): Admission: RE | Admit: 2024-07-06 | Source: Ambulatory Visit | Attending: Cardiology | Admitting: Cardiology

## 2024-07-07 ENCOUNTER — Telehealth (HOSPITAL_COMMUNITY): Payer: Self-pay | Admitting: Cardiology

## 2024-07-07 NOTE — Telephone Encounter (Signed)
 07/06/24 NO SHOWED ECHO - We will not call patient to reschedule due to NO SHOW RATE 29%. If patient calls back we will reschedule one more time. Order will be removed from the active echo WQ. Thank you.

## 2024-07-25 ENCOUNTER — Other Ambulatory Visit: Payer: Self-pay | Admitting: Nurse Practitioner

## 2024-07-25 ENCOUNTER — Other Ambulatory Visit: Payer: Self-pay

## 2024-07-25 DIAGNOSIS — I152 Hypertension secondary to endocrine disorders: Secondary | ICD-10-CM

## 2024-07-25 MED ORDER — OLMESARTAN MEDOXOMIL-HCTZ 40-12.5 MG PO TABS
1.0000 | ORAL_TABLET | Freq: Every day | ORAL | 0 refills | Status: AC
  Filled 2024-07-25: qty 90, 90d supply, fill #0

## 2024-07-27 ENCOUNTER — Other Ambulatory Visit: Payer: Self-pay | Admitting: Internal Medicine

## 2024-07-27 ENCOUNTER — Other Ambulatory Visit: Payer: Self-pay

## 2024-07-27 DIAGNOSIS — E1165 Type 2 diabetes mellitus with hyperglycemia: Secondary | ICD-10-CM

## 2024-07-27 MED ORDER — INSULIN LISPRO (1 UNIT DIAL) 100 UNIT/ML (KWIKPEN)
8.0000 [IU] | PEN_INJECTOR | Freq: Three times a day (TID) | SUBCUTANEOUS | 3 refills | Status: AC
  Filled 2024-07-27 – 2024-08-10 (×2): qty 6, 25d supply, fill #0
  Filled 2024-08-30: qty 6, 25d supply, fill #1

## 2024-08-02 ENCOUNTER — Other Ambulatory Visit: Payer: Self-pay | Admitting: Nurse Practitioner

## 2024-08-02 DIAGNOSIS — E1165 Type 2 diabetes mellitus with hyperglycemia: Secondary | ICD-10-CM

## 2024-08-03 ENCOUNTER — Other Ambulatory Visit: Payer: Self-pay

## 2024-08-03 MED ORDER — METFORMIN HCL 1000 MG PO TABS
1000.0000 mg | ORAL_TABLET | Freq: Two times a day (BID) | ORAL | 2 refills | Status: AC
  Filled 2024-08-03: qty 180, 90d supply, fill #0

## 2024-08-04 ENCOUNTER — Other Ambulatory Visit: Payer: Self-pay

## 2024-08-08 ENCOUNTER — Other Ambulatory Visit: Payer: Self-pay

## 2024-08-10 ENCOUNTER — Other Ambulatory Visit: Payer: Self-pay

## 2024-08-12 ENCOUNTER — Other Ambulatory Visit: Payer: Self-pay

## 2024-08-15 ENCOUNTER — Other Ambulatory Visit: Payer: Self-pay

## 2024-08-19 ENCOUNTER — Other Ambulatory Visit: Payer: Self-pay

## 2024-08-30 ENCOUNTER — Other Ambulatory Visit: Payer: Self-pay
